# Patient Record
Sex: Male | Born: 1935 | Race: White | Marital: Married | Smoking: Never smoker
Health system: Southern US, Community
[De-identification: ages and names within clinical notes are randomized; demographics above are authoritative.]

## PROBLEM LIST (undated history)

## (undated) DIAGNOSIS — N183 Chronic kidney disease, stage 3 unspecified: Secondary | ICD-10-CM

## (undated) DIAGNOSIS — E039 Hypothyroidism, unspecified: Secondary | ICD-10-CM

## (undated) DIAGNOSIS — E119 Type 2 diabetes mellitus without complications: Secondary | ICD-10-CM

## (undated) DIAGNOSIS — K219 Gastro-esophageal reflux disease without esophagitis: Secondary | ICD-10-CM

## (undated) DIAGNOSIS — E785 Hyperlipidemia, unspecified: Secondary | ICD-10-CM

## (undated) DIAGNOSIS — Z8546 Personal history of malignant neoplasm of prostate: Secondary | ICD-10-CM

## (undated) DIAGNOSIS — I509 Heart failure, unspecified: Secondary | ICD-10-CM

## (undated) DIAGNOSIS — M549 Dorsalgia, unspecified: Secondary | ICD-10-CM

## (undated) DIAGNOSIS — I10 Essential (primary) hypertension: Secondary | ICD-10-CM

## (undated) DIAGNOSIS — M1990 Unspecified osteoarthritis, unspecified site: Secondary | ICD-10-CM

## (undated) DIAGNOSIS — E1121 Type 2 diabetes mellitus with diabetic nephropathy: Secondary | ICD-10-CM

## (undated) HISTORY — PX: ORIF CLAVICLE FRACTURE: SUR924

## (undated) HISTORY — DX: Chronic kidney disease, stage 3 unspecified: N18.30

## (undated) HISTORY — DX: Essential (primary) hypertension: I10

## (undated) HISTORY — DX: Hypothyroidism, unspecified: E03.9

## (undated) HISTORY — DX: Type 2 diabetes mellitus with diabetic nephropathy: E11.21

## (undated) HISTORY — DX: Hyperlipidemia, unspecified: E78.5

## (undated) HISTORY — DX: Gastro-esophageal reflux disease without esophagitis: K21.9

## (undated) HISTORY — DX: Dorsalgia, unspecified: M54.9

## (undated) HISTORY — DX: Unspecified osteoarthritis, unspecified site: M19.90

## (undated) HISTORY — PX: APPENDECTOMY: SHX54

## (undated) HISTORY — DX: Personal history of malignant neoplasm of prostate: Z85.46

## (undated) HISTORY — PX: PROSTATE SURGERY: SHX751

## (undated) HISTORY — DX: Heart failure, unspecified: I50.9

## (undated) HISTORY — DX: Type 2 diabetes mellitus without complications: E11.9

---

## 2011-01-18 ENCOUNTER — Other Ambulatory Visit: Payer: Self-pay | Admitting: Orthopedic Surgery

## 2011-01-18 DIAGNOSIS — M545 Low back pain: Secondary | ICD-10-CM

## 2011-01-23 ENCOUNTER — Ambulatory Visit
Admission: RE | Admit: 2011-01-23 | Discharge: 2011-01-23 | Disposition: A | Discharge status: Home / Self Care | Payer: Medicare Other | Source: Ambulatory Visit | Attending: Orthopedic Surgery | Admitting: Orthopedic Surgery

## 2011-01-23 DIAGNOSIS — M545 Low back pain: Secondary | ICD-10-CM

## 2011-12-27 IMAGING — CT CT L SPINE W/O CM
4 of 10 series · 13 of 33 positions shown, 15 images · non-contrast
Comparison: None.

CLINICAL DATA: Low back and right leg pain for 1 year, getting
worse.  History of prostate cancer.

CT LUMBAR SPINE WITHOUT CONTRAST
TECHNIQUE: Multidetector CT imaging of the lumbar spine was
performed without intravenous contrast administration. Multiplanar
CT image reconstructions were also generated.

[Series 2: l spine bone · axial · 0.27mm/px · z∈[-160,-76]mm · 2 of 102 slices shown, 3 images]
[im 34/102  soft-tissue]
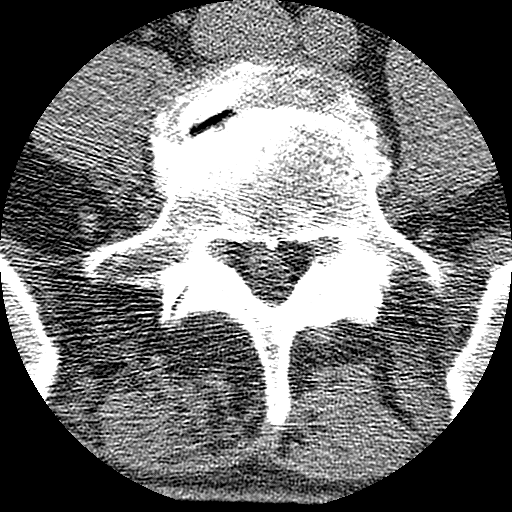
[im 34/102  bone]
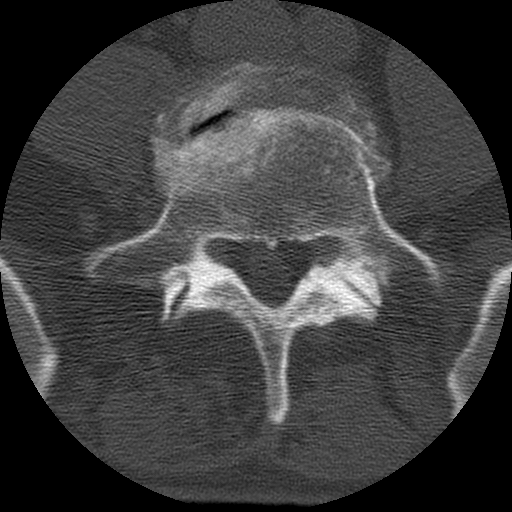
[im 68/102  bone]
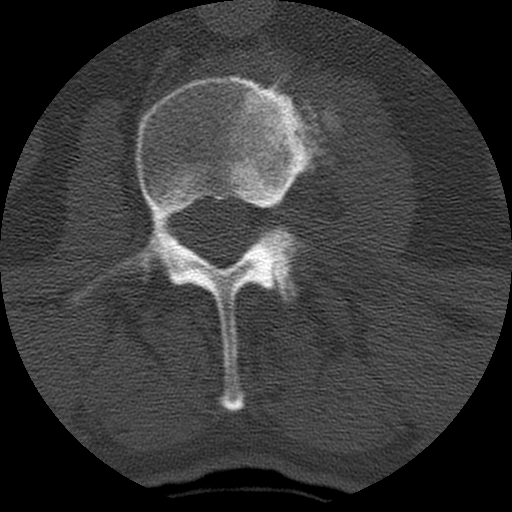

[Series 3: l spine soft · axial · 0.27mm/px · z∈[-180,-56]mm · 3 of 102 slices shown]
[im 26/102  soft-tissue]
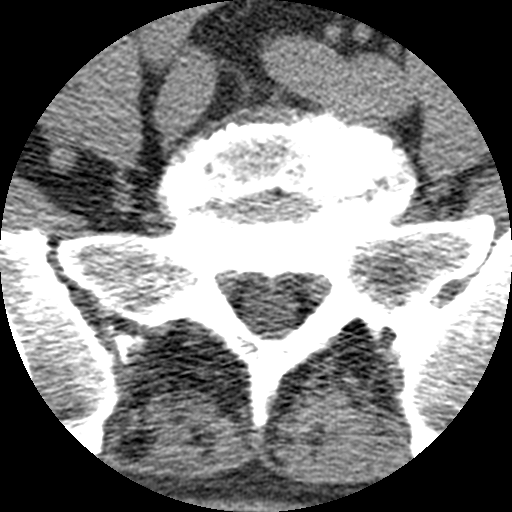
[im 51/102  soft-tissue]
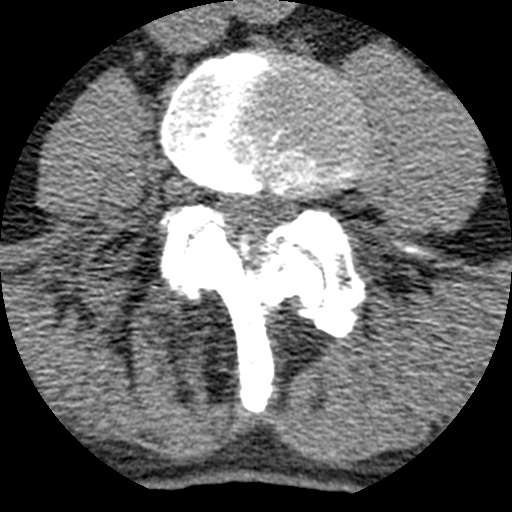
[im 76/102  soft-tissue]
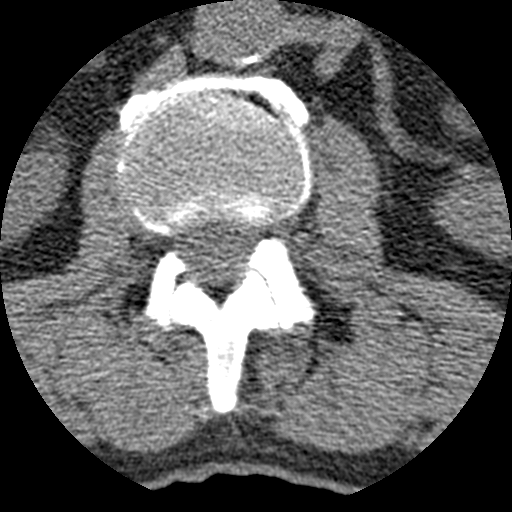

[Series 103: cor · coronal · 0.51mm/px · 3 of 60 slices shown]
[im 15/60  bone]
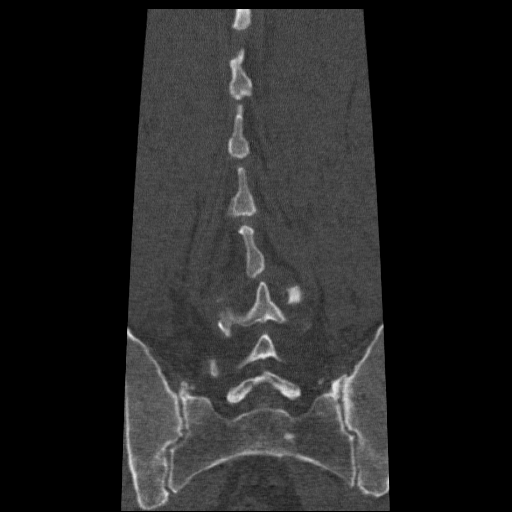
[im 30/60  bone]
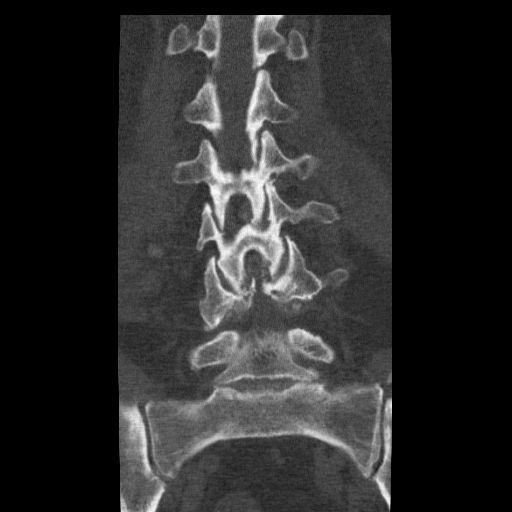
[im 45/60  bone]
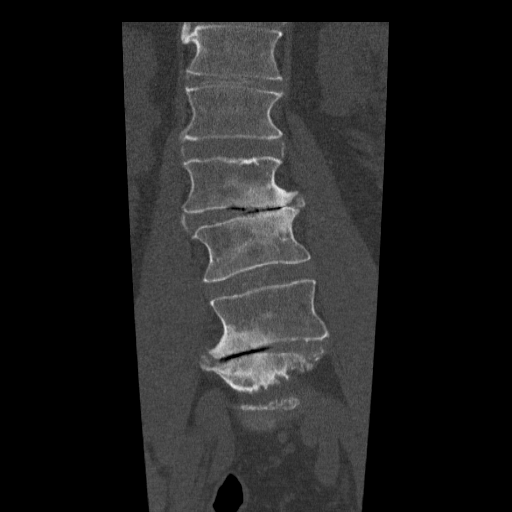

[Series 105: sag · sagittal · 0.51mm/px · 5 of 60 slices shown, 6 images]
[im 20/60  bone]
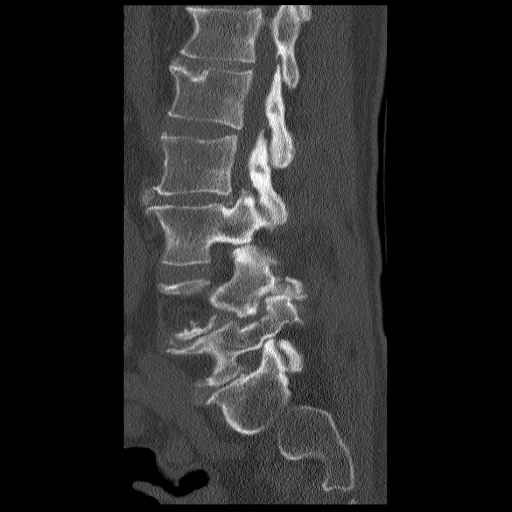
[im 25/60  bone]
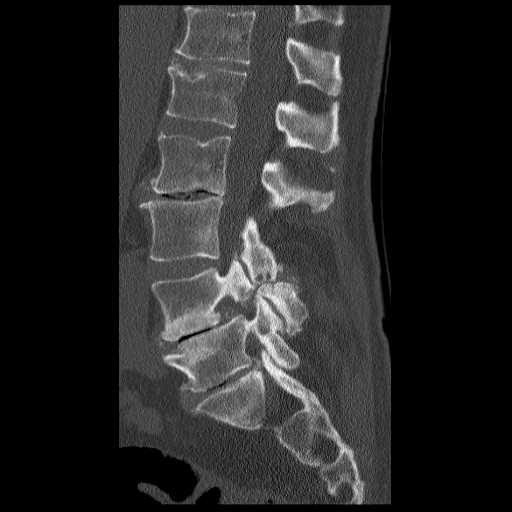
[im 30/60  soft-tissue]
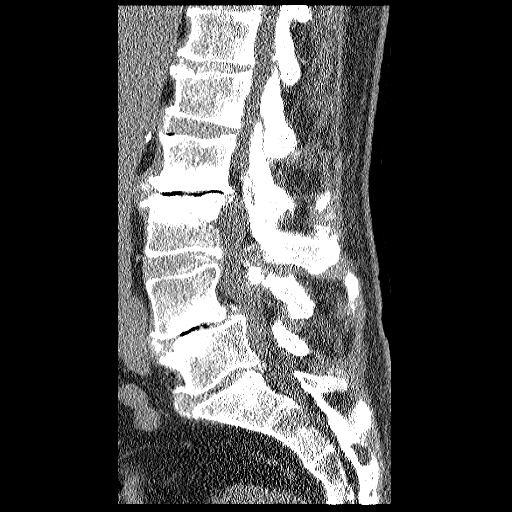
[im 30/60  bone]
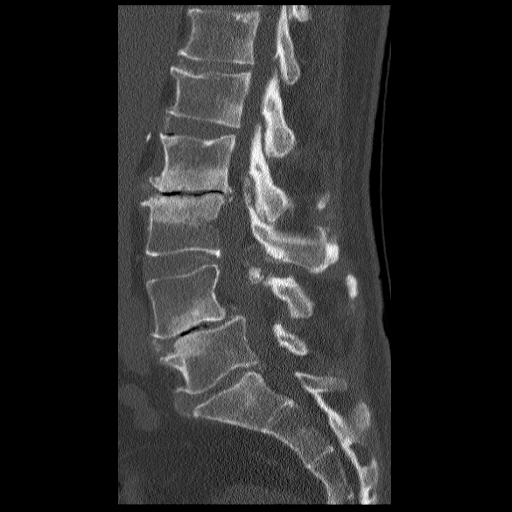
[im 35/60  bone]
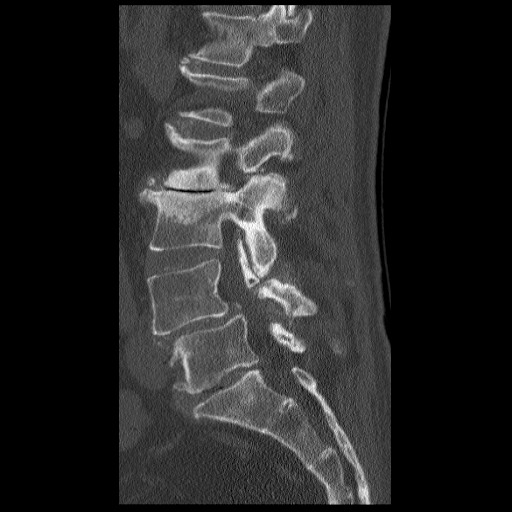
[im 40/60  bone]
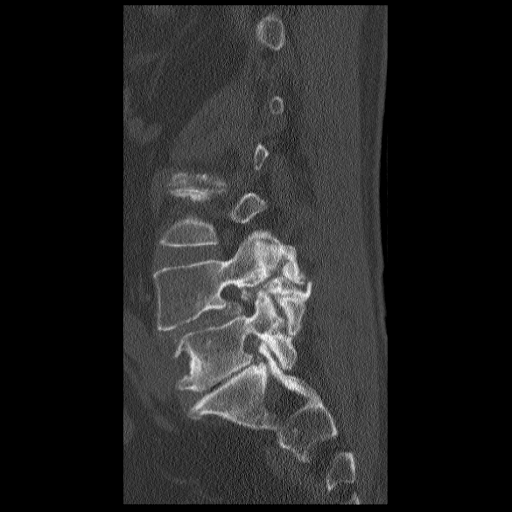

[13 of 33 positions shown; findings below may reference images not displayed]

FINDINGS: 7 mm anterolisthesis L4 on L5 relates to facet
degenerative change and bilateral L4 spondylolysis.  Recommend
standing flexion/extension views to evaluate for dynamic
instability.

Advanced degenerative disc disease at L2-3, L4-5, and L5-S1.
Sclerotic changes above below these levels are most consistent with
degenerative change rather than a sclerotic metastatic disease.

A tiny sclerotic lucency left S2 could be a metastasis.  Correlate
with PSA and bone scan.  Sclerotic posterior elements are likely
degenerative.

Degenerative scoliosis is observed related to asymmetric loss of
interspace height at L2-3 on the left and  asymmetric loss of
interspace height at L4-5 on the right.  The L5-S1 disc space is
narrowed diffusely.

Nonaneurysmal atherosclerotic calcification of the aortoiliac
system.  No visible retroperitoneal adenopathy.  Moderate bilateral
SI joint sclerotic change.

The individual disc spaces are examined as follows:

L1-2:  Mild facet arthropathy.  Anterior spurring.  No clear-cut
posterior disc protrusion or spinal stenosis.

L2-3:  Asymmetric loss of interspace height on the left associated
with calcified central and leftward protrusion extending into the
foramen along with a large osteophytic spur.  Left greater than
right facet arthropathy.  Left L2 and left L3 nerve root
encroachment.

L3-4:  Advanced facet arthropathy.  Mild annular bulging.  Mild
central canal stenosis without definite L4 or L3 nerve root
encroachment.

L4-5:  Bilateral L4 spondylolysis.  7 mm anterolisthesis with the
patient recumbent for CT.  Central bulging annular fibers with
vacuum disc phenomenon, worse on the right.  Focal protrusion
extends into the right neural foramen.  Advanced facet arthropathy.
Moderate central canal stenosis.  Bilateral L5 nerve root
encroachment  worse on the right.  Bilateral neural foraminal
narrowing affects both L4 nerve roots, right greater than left.

L5-S1:  Shallow central protrusion.  Moderate facet arthropathy.
Disc material extends into both neural foramina.  No S1 nerve root
compression in the canal but there is significant foraminal
narrowing affecting both L5 nerve roots.
IMPRESSION: Bilateral L4 spondylolysis with 7 mm grade I spondylolisthesis L4
on L5. Right greater than left L4 and L5 nerve root encroachment
are present at this level.

Disc space narrowing at L5-S1 with central protrusion extending
into both neural foramina along with posterior element hypertrophy.
Bilateral L5 neural encroachment is  likely at this level.

Predominately leftward pathology at L2-3 with disc space narrowing
and bony overgrowth as well as a calcified central leftward
protrusion affects the left L2 the left L3 nerve roots.

 Single sclerotic density at S2 on the left could represent a
metastasis.  No other suspicious sclerotic densities are seen
however.

## 2015-03-16 DIAGNOSIS — N183 Chronic kidney disease, stage 3 (moderate): Secondary | ICD-10-CM | POA: Diagnosis not present

## 2015-03-16 DIAGNOSIS — I5022 Chronic systolic (congestive) heart failure: Secondary | ICD-10-CM | POA: Diagnosis not present

## 2015-03-16 DIAGNOSIS — I10 Essential (primary) hypertension: Secondary | ICD-10-CM | POA: Diagnosis not present

## 2015-06-16 DIAGNOSIS — E785 Hyperlipidemia, unspecified: Secondary | ICD-10-CM | POA: Diagnosis not present

## 2015-06-16 DIAGNOSIS — Z23 Encounter for immunization: Secondary | ICD-10-CM | POA: Diagnosis not present

## 2015-06-16 DIAGNOSIS — I10 Essential (primary) hypertension: Secondary | ICD-10-CM | POA: Diagnosis not present

## 2015-06-16 DIAGNOSIS — N183 Chronic kidney disease, stage 3 (moderate): Secondary | ICD-10-CM | POA: Diagnosis not present

## 2015-07-27 DIAGNOSIS — K5730 Diverticulosis of large intestine without perforation or abscess without bleeding: Secondary | ICD-10-CM | POA: Diagnosis not present

## 2015-07-27 DIAGNOSIS — K219 Gastro-esophageal reflux disease without esophagitis: Secondary | ICD-10-CM | POA: Diagnosis not present

## 2015-08-23 DIAGNOSIS — D126 Benign neoplasm of colon, unspecified: Secondary | ICD-10-CM | POA: Diagnosis not present

## 2015-08-23 DIAGNOSIS — N189 Chronic kidney disease, unspecified: Secondary | ICD-10-CM | POA: Diagnosis not present

## 2015-08-23 DIAGNOSIS — Z1211 Encounter for screening for malignant neoplasm of colon: Secondary | ICD-10-CM | POA: Diagnosis not present

## 2015-08-23 DIAGNOSIS — I129 Hypertensive chronic kidney disease with stage 1 through stage 4 chronic kidney disease, or unspecified chronic kidney disease: Secondary | ICD-10-CM | POA: Diagnosis not present

## 2015-08-23 DIAGNOSIS — Z9049 Acquired absence of other specified parts of digestive tract: Secondary | ICD-10-CM | POA: Diagnosis not present

## 2015-08-23 DIAGNOSIS — K5730 Diverticulosis of large intestine without perforation or abscess without bleeding: Secondary | ICD-10-CM | POA: Diagnosis not present

## 2015-08-23 DIAGNOSIS — D122 Benign neoplasm of ascending colon: Secondary | ICD-10-CM | POA: Diagnosis not present

## 2015-08-23 DIAGNOSIS — D124 Benign neoplasm of descending colon: Secondary | ICD-10-CM | POA: Diagnosis not present

## 2015-08-23 DIAGNOSIS — Z86010 Personal history of colonic polyps: Secondary | ICD-10-CM | POA: Diagnosis not present

## 2015-09-14 DIAGNOSIS — N183 Chronic kidney disease, stage 3 (moderate): Secondary | ICD-10-CM | POA: Diagnosis not present

## 2015-09-14 DIAGNOSIS — N185 Chronic kidney disease, stage 5: Secondary | ICD-10-CM | POA: Diagnosis not present

## 2015-09-14 DIAGNOSIS — E7800 Pure hypercholesterolemia, unspecified: Secondary | ICD-10-CM | POA: Diagnosis not present

## 2015-09-14 DIAGNOSIS — E785 Hyperlipidemia, unspecified: Secondary | ICD-10-CM | POA: Diagnosis not present

## 2015-09-14 DIAGNOSIS — I10 Essential (primary) hypertension: Secondary | ICD-10-CM | POA: Diagnosis not present

## 2019-12-01 DIAGNOSIS — E1165 Type 2 diabetes mellitus with hyperglycemia: Secondary | ICD-10-CM | POA: Diagnosis not present

## 2019-12-01 DIAGNOSIS — I10 Essential (primary) hypertension: Secondary | ICD-10-CM | POA: Diagnosis not present

## 2020-02-25 DIAGNOSIS — J069 Acute upper respiratory infection, unspecified: Secondary | ICD-10-CM | POA: Diagnosis not present

## 2020-02-25 DIAGNOSIS — Z20828 Contact with and (suspected) exposure to other viral communicable diseases: Secondary | ICD-10-CM | POA: Diagnosis not present

## 2020-03-23 DIAGNOSIS — I10 Essential (primary) hypertension: Secondary | ICD-10-CM | POA: Diagnosis not present

## 2020-03-23 DIAGNOSIS — E119 Type 2 diabetes mellitus without complications: Secondary | ICD-10-CM | POA: Diagnosis not present

## 2020-06-27 DIAGNOSIS — Z125 Encounter for screening for malignant neoplasm of prostate: Secondary | ICD-10-CM | POA: Diagnosis not present

## 2020-06-27 DIAGNOSIS — E039 Hypothyroidism, unspecified: Secondary | ICD-10-CM | POA: Diagnosis not present

## 2020-06-27 DIAGNOSIS — E119 Type 2 diabetes mellitus without complications: Secondary | ICD-10-CM | POA: Diagnosis not present

## 2020-06-27 DIAGNOSIS — M4802 Spinal stenosis, cervical region: Secondary | ICD-10-CM | POA: Diagnosis not present

## 2020-06-27 DIAGNOSIS — Z0000 Encounter for general adult medical examination without abnormal findings: Secondary | ICD-10-CM | POA: Diagnosis not present

## 2020-06-27 DIAGNOSIS — N1831 Chronic kidney disease, stage 3a: Secondary | ICD-10-CM | POA: Diagnosis not present

## 2020-06-27 DIAGNOSIS — E7800 Pure hypercholesterolemia, unspecified: Secondary | ICD-10-CM | POA: Diagnosis not present

## 2020-06-27 DIAGNOSIS — Z6824 Body mass index (BMI) 24.0-24.9, adult: Secondary | ICD-10-CM | POA: Diagnosis not present

## 2020-06-27 DIAGNOSIS — I10 Essential (primary) hypertension: Secondary | ICD-10-CM | POA: Diagnosis not present

## 2020-06-27 DIAGNOSIS — Z23 Encounter for immunization: Secondary | ICD-10-CM | POA: Diagnosis not present

## 2020-06-27 DIAGNOSIS — Z1389 Encounter for screening for other disorder: Secondary | ICD-10-CM | POA: Diagnosis not present

## 2020-09-28 DIAGNOSIS — H527 Unspecified disorder of refraction: Secondary | ICD-10-CM | POA: Diagnosis not present

## 2020-09-28 DIAGNOSIS — I10 Essential (primary) hypertension: Secondary | ICD-10-CM | POA: Diagnosis not present

## 2020-09-28 DIAGNOSIS — E119 Type 2 diabetes mellitus without complications: Secondary | ICD-10-CM | POA: Diagnosis not present

## 2020-09-28 DIAGNOSIS — H3589 Other specified retinal disorders: Secondary | ICD-10-CM | POA: Diagnosis not present

## 2020-09-28 DIAGNOSIS — Z961 Presence of intraocular lens: Secondary | ICD-10-CM | POA: Diagnosis not present

## 2020-12-28 DIAGNOSIS — E119 Type 2 diabetes mellitus without complications: Secondary | ICD-10-CM | POA: Diagnosis not present

## 2021-04-03 DIAGNOSIS — N1831 Chronic kidney disease, stage 3a: Secondary | ICD-10-CM | POA: Diagnosis not present

## 2021-04-03 DIAGNOSIS — I10 Essential (primary) hypertension: Secondary | ICD-10-CM | POA: Diagnosis not present

## 2021-04-03 DIAGNOSIS — E1121 Type 2 diabetes mellitus with diabetic nephropathy: Secondary | ICD-10-CM | POA: Diagnosis not present

## 2021-07-05 DIAGNOSIS — Z23 Encounter for immunization: Secondary | ICD-10-CM | POA: Diagnosis not present

## 2021-07-05 DIAGNOSIS — E039 Hypothyroidism, unspecified: Secondary | ICD-10-CM | POA: Diagnosis not present

## 2021-07-05 DIAGNOSIS — Z8546 Personal history of malignant neoplasm of prostate: Secondary | ICD-10-CM | POA: Diagnosis not present

## 2021-07-05 DIAGNOSIS — N1831 Chronic kidney disease, stage 3a: Secondary | ICD-10-CM | POA: Diagnosis not present

## 2021-07-05 DIAGNOSIS — I10 Essential (primary) hypertension: Secondary | ICD-10-CM | POA: Diagnosis not present

## 2021-07-05 DIAGNOSIS — E1121 Type 2 diabetes mellitus with diabetic nephropathy: Secondary | ICD-10-CM | POA: Diagnosis not present

## 2021-07-05 DIAGNOSIS — Z6822 Body mass index (BMI) 22.0-22.9, adult: Secondary | ICD-10-CM | POA: Diagnosis not present

## 2021-07-05 DIAGNOSIS — E7800 Pure hypercholesterolemia, unspecified: Secondary | ICD-10-CM | POA: Diagnosis not present

## 2021-07-05 DIAGNOSIS — Z0000 Encounter for general adult medical examination without abnormal findings: Secondary | ICD-10-CM | POA: Diagnosis not present

## 2021-07-05 DIAGNOSIS — Z1331 Encounter for screening for depression: Secondary | ICD-10-CM | POA: Diagnosis not present

## 2021-10-04 DIAGNOSIS — E119 Type 2 diabetes mellitus without complications: Secondary | ICD-10-CM | POA: Diagnosis not present

## 2021-10-04 DIAGNOSIS — H40051 Ocular hypertension, right eye: Secondary | ICD-10-CM | POA: Diagnosis not present

## 2021-10-06 DIAGNOSIS — I10 Essential (primary) hypertension: Secondary | ICD-10-CM | POA: Diagnosis not present

## 2021-10-06 DIAGNOSIS — E1121 Type 2 diabetes mellitus with diabetic nephropathy: Secondary | ICD-10-CM | POA: Diagnosis not present

## 2021-10-06 DIAGNOSIS — E7800 Pure hypercholesterolemia, unspecified: Secondary | ICD-10-CM | POA: Diagnosis not present

## 2021-10-06 DIAGNOSIS — N1831 Chronic kidney disease, stage 3a: Secondary | ICD-10-CM | POA: Diagnosis not present

## 2022-01-05 DIAGNOSIS — E7800 Pure hypercholesterolemia, unspecified: Secondary | ICD-10-CM | POA: Diagnosis not present

## 2022-01-05 DIAGNOSIS — E039 Hypothyroidism, unspecified: Secondary | ICD-10-CM | POA: Diagnosis not present

## 2022-01-05 DIAGNOSIS — E119 Type 2 diabetes mellitus without complications: Secondary | ICD-10-CM | POA: Diagnosis not present

## 2022-01-05 DIAGNOSIS — I10 Essential (primary) hypertension: Secondary | ICD-10-CM | POA: Diagnosis not present

## 2022-04-09 DIAGNOSIS — N1831 Chronic kidney disease, stage 3a: Secondary | ICD-10-CM | POA: Diagnosis not present

## 2022-04-09 DIAGNOSIS — I10 Essential (primary) hypertension: Secondary | ICD-10-CM | POA: Diagnosis not present

## 2022-04-09 DIAGNOSIS — E1121 Type 2 diabetes mellitus with diabetic nephropathy: Secondary | ICD-10-CM | POA: Diagnosis not present

## 2022-04-12 DIAGNOSIS — H40051 Ocular hypertension, right eye: Secondary | ICD-10-CM | POA: Diagnosis not present

## 2022-04-26 DIAGNOSIS — H353131 Nonexudative age-related macular degeneration, bilateral, early dry stage: Secondary | ICD-10-CM | POA: Diagnosis not present

## 2022-04-26 DIAGNOSIS — E119 Type 2 diabetes mellitus without complications: Secondary | ICD-10-CM | POA: Diagnosis not present

## 2022-04-26 DIAGNOSIS — H35372 Puckering of macula, left eye: Secondary | ICD-10-CM | POA: Diagnosis not present

## 2022-06-11 DIAGNOSIS — H5203 Hypermetropia, bilateral: Secondary | ICD-10-CM | POA: Diagnosis not present

## 2022-07-11 DIAGNOSIS — E1121 Type 2 diabetes mellitus with diabetic nephropathy: Secondary | ICD-10-CM | POA: Diagnosis not present

## 2022-07-11 DIAGNOSIS — Z23 Encounter for immunization: Secondary | ICD-10-CM | POA: Diagnosis not present

## 2022-07-11 DIAGNOSIS — N1831 Chronic kidney disease, stage 3a: Secondary | ICD-10-CM | POA: Diagnosis not present

## 2022-09-14 ENCOUNTER — Other Ambulatory Visit: Payer: Self-pay

## 2022-09-14 MED ORDER — RYBELSUS 7 MG PO TABS: 1.0000 | ORAL_TABLET | Freq: Every morning | ORAL | 5 refills | Status: DC

## 2022-10-16 ENCOUNTER — Ambulatory Visit: Payer: Medicare PPO | Admitting: Internal Medicine

## 2022-10-16 ENCOUNTER — Encounter: Payer: Self-pay | Admitting: Internal Medicine

## 2022-10-16 VITALS — BP 140/80 | HR 73 | Temp 97.3°F | Resp 16 | Ht 71.0 in | Wt 178.4 lb

## 2022-10-16 DIAGNOSIS — Z8546 Personal history of malignant neoplasm of prostate: Secondary | ICD-10-CM | POA: Diagnosis not present

## 2022-10-16 DIAGNOSIS — E7800 Pure hypercholesterolemia, unspecified: Secondary | ICD-10-CM | POA: Insufficient documentation

## 2022-10-16 DIAGNOSIS — K219 Gastro-esophageal reflux disease without esophagitis: Secondary | ICD-10-CM | POA: Diagnosis not present

## 2022-10-16 DIAGNOSIS — Z6824 Body mass index (BMI) 24.0-24.9, adult: Secondary | ICD-10-CM | POA: Diagnosis not present

## 2022-10-16 DIAGNOSIS — M159 Polyosteoarthritis, unspecified: Secondary | ICD-10-CM | POA: Diagnosis not present

## 2022-10-16 DIAGNOSIS — E1121 Type 2 diabetes mellitus with diabetic nephropathy: Secondary | ICD-10-CM

## 2022-10-16 DIAGNOSIS — I10 Essential (primary) hypertension: Secondary | ICD-10-CM

## 2022-10-16 DIAGNOSIS — E039 Hypothyroidism, unspecified: Secondary | ICD-10-CM | POA: Diagnosis not present

## 2022-10-16 DIAGNOSIS — N1831 Chronic kidney disease, stage 3a: Secondary | ICD-10-CM | POA: Insufficient documentation

## 2022-10-16 DIAGNOSIS — M5030 Other cervical disc degeneration, unspecified cervical region: Secondary | ICD-10-CM

## 2022-10-16 DIAGNOSIS — Z0000 Encounter for general adult medical examination without abnormal findings: Secondary | ICD-10-CM | POA: Diagnosis not present

## 2022-10-16 NOTE — Assessment & Plan Note (Signed)
He has a history of prostate cancer with remission.  He denies any urinary complaints today.

## 2022-10-16 NOTE — Assessment & Plan Note (Signed)
His BP is borderline.  We will see what his BP is doing on his next visit.

## 2022-10-16 NOTE — Assessment & Plan Note (Signed)
His reflux is controlled on nexium.

## 2022-10-16 NOTE — Assessment & Plan Note (Signed)
He remains on an ACE-I at this time.  I want him to avoid NSAIDS as possible.

## 2022-10-16 NOTE — Progress Notes (Signed)
Preventive Screening-Counseling & Management     Daved MASSIE MEES is a 87 y.o. male who presents for Medicare Annual/Subsequent preventive examination.  Rashon K. Claggett is a 87 year old Caucasian/White male who presents for his annual wellness exam.  This patient's past medical history Back Pain, Chronic kidney disease, Stage III (moderate), Congestive Heart Failure, Diabetes Mellitus, Type II, GERD, Hyperlipidemia, Hypertension, Hypothyroidism, and Prostate Neoplasm, Malignant.   He had a dilated eye exam performed on 10/06/2021 where they are following him for macular degeneration but he had no diabetic retinopathy. He states his vision is doing well.  He has an annual eye appointment scheduled next week with [facility].  He did have a colonoscopy done on 07/2015 which showed colon polyps and some diverticulosis. GI wrote to see him back for repeat colonoscopy in 3 years but the patient states they told him he did not have to come back unless he wanted to. He does have a history of prostate cancer diagnosed around 2007 where he has a prostatectomy. He denies any problems with urination at this time. He never received chemo or radiation treatment and saw his urologist on 12/2015 where he was not having any urinary complaints and they discharged him from their service. 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 The patient does get yearly flu vaccine. He had the pneumovax 23 vaccine in 02/2019. He has a zostavax vaccine since the age of 61. He had a prevnar 13 vaccine in 11/2014. He is not interested in the RSV vaccine.  He has had 3 COVID-19 vaccines including 1 booster. The patient denies any memory loss, depression or anxiety. He is on an ASA 81 mg daily.   The patient is a 87 year old Caucasian/White male who returns for a follow-up visit for his T2 diabetes. Last year year his HgBA1c was elevated in 11/2019 and we started him on rybelsus.  He was diagnosed with T2 diabetes in 12/2016.  He remains on  metformin ER 2000mg  daily and rybelsus 7mg  daily.  Since the last visit, there have been no problems.  He is not walking as much as they would like. He specifically denies unexplained abdominal pain, nausea or vomiting, documented hypoglycemia.  He does check his blood sugars about once every couple of weeks and they range 100-200. His last HgbA1c was done 3 months ago and was 5.8%. He has no term complications of diabetes with no diabetic retinopathy, neuropathy or cardiovascular disease.  He does have a history of diabetic nephropathy at this time.    He has a history of Stage IIIa CKD where his last visit with his nephrologist was in 08/2015.  He was told his kidneys were stable. He has been told to stop his lasix.  Again, he has had short term dialysis several years ago when he was admitted to [facility].  His Kidney function near discharge at [facility] last year was 2.1 and his repeat checks in our office his creatinine has range 1.5-2. Patient states he has no problems with urination.  The patient is a 87 year old Caucasian/White male who presents for a follow-up evaluation of hypertension.  This past year, his BP was elevated and I increased his amlodipine from 5mg  to 10mg  daily.    Last year we increased his lisinopril from 30mg  to 40mg  daily.  The patient has not been checking his blood pressure at home. The patient's current medications include: amlodipine 10 mg daily and lisinopril 40 mg daily. The  patient has been tolerating his medications well. The patient denies any headache, visual changes, dizziness, lightheadness, chest pain, shortness of breath, weakness/numbness, and edema.  He reports there have been no other symptoms noted.      Tevion K. Brouhard returns today for routine followup on his cholesterol.  On his last visit, his cholesterol was too controlled and I asked him to start taking his crestor 5mg  every other day.  Overall, he states he is doing well and is without any complaints or  problems at this time. He specifically denies abdominal pain, nausea, vomiting, diarrhea, myalgias, and fatigue. He remains on dietary management as well as the following cholesterol lowering medications Fish Oil 2400mg , crestor 5mg  po every other day, and fenofibrate 135mg . He has eaten already today.   The patient is a 87 year old White male who returns for a regularly scheduled thyroid check. Since the last visit, there has been no overall change in her status. She remains on levothyroxine Oral tablet 125 mcg daly. He claims to have no symptoms suggestive of thyroid imbalance specifically denying fatigue, cold intolerance, heat intolerance, tremors, anxiety, unexplained weight changes, diarrhea, constipation and insomnia.     This patient also has mild chronic systolic CHF which we diagnosed in 2013.  He has had a history of moderately severe CHF in the past where a stress test showed no evidence of ischemia but he did have an EF 26% with global hypokinesis.  An ECHO in 2013 showed a mildly reduced LV systolic function but his recent ECHO from 10/2013 showed a normal systolic function with an EF 53% with mild MR.  He has no baseline symptoms of CHF.  He has not seen a cardiologist since then.  Specifically denied complaints: chest pain, palpitations, exertional dyspnea, worsening orthopnea, worsening edema, and worsening exertional dyspnea. Interval history: none.  An EKG was done in 09/2013 which showed sinus brady with some nonspecific ST changes.   He does have Osteoarthritis of his knees which has affected his exercise.  He also had  swelling in his left wrist in the past where he was sent to rheumatology and was told it was arthritis and was given a steriod injection.  He has not had any problems with his wrist since.  He states he is not having problems with his knees at this time and he denies taking any OTC meds for his arthritis.  Today, he states he has arthritic pain including his buttocks, ankles  and feet.  The patient also has a history of GERD which is controlled with nexium.  If he does not takes his meds, he will have reflux symptoms the next day.     Mr. Cabreja also has a history of anterior decompression of his cervical spine (AICD) on 11/30/2013 of his C4-C5 and C5-C6 spine with Dr. 11/2013.  Since his surgery, the numbness in his left arm is now gone.  Again, the patient came and saw me with neck pain in 08/2013.  I obtained plain films of his neck which showed DDD but he continued to complain of pain where I referred him to Dr. Sudie Bailey.  Dr. 01/30/2014 obtained a MRI of his cervical spine which demonstrated DDD with foraminal stenosis affected the right C4 nerve root, foraminal stenosis affecting both C5 nerve roots with mild central stenosis, smal central disc protrusion of C5-C6 with mild central stenosis with bilateral foraminal stenosis with collapse of the disc space with stenosis affecting both C6 nerve roots, bilateral stenosis of  C6-C7- with overall moderate multilevel cervical spondylosis and multilevel foraminal stenosis as described.  Currently states no problems with his neck since his surgery.    Mr. Stern also has had problems with gynecomastia in the past.  He had a mammogram which showed bilateral gynecomastia R>L but no masses.  We also obtained lab work and his estrogen levels were elevated.  At that point, I had stopped him off of his protonix and his swelling and pain resolved.                     Are there smokers in your home (other than you)? Yes  Risk Factors Current exercise habits:  as above   Dietary issues discussed: none   Depression Screen (Note: if answer to either of the following is "Yes", a more complete depression screening is indicated)   Over the past two weeks, have you felt down, depressed or hopeless? No  Over the past two weeks, have you felt little interest or pleasure in doing things? No  Have you lost interest or pleasure in  daily life? No  Do you often feel hopeless? No  Do you cry easily over simple problems? No  Activities of Daily Living In your present state of health, do you have any difficulty performing the following activities?:  Driving? No Managing money?  No Feeding yourself? No Getting from bed to chair? No Climbing a flight of stairs? No Preparing food and eating?: No Bathing or showering? No Getting dressed: No Getting to the toilet? No Using the toilet:No Moving around from place to place: No In the past year have you fallen or had a near fall?:No   Are you sexually active?  No  Do you have more than one partner?  No  Hearing Difficulties: No Do you often ask people to speak up or repeat themselves? No Do you experience ringing or noises in your ears? No Do you have difficulty understanding soft or whispered voices? No   Do you feel that you have a problem with memory? No  Do you often misplace items? No  Do you feel safe at home?  Yes  Cognitive Testing  Alert? Yes  Normal Appearance?Yes  Oriented to person? Yes  Place? Yes   Time? Yes  Recall of three objects?  Yes  Can perform simple calculations? Yes  Displays appropriate judgment?Yes  Can read the correct time from a watch face?Yes  Fall Risk Prevention  Any stairs in or around the home? Yes  If so, are there any without handrails? Yes  Home free of loose throw rugs in walkways, pet beds, electrical cords, etc? No  Adequate lighting in your home to reduce risk of falls? Yes  Use of a cane, walker or w/c? No    Time Up and Go  Was the test performed? Yes .  Length of time to ambulate 10 feet: 10 sec.   Gait unsteady without use of assistive device, provider informed and interventions were implemented    Advanced Directives have been discussed with the patient? No   List the Names of Other Physician/Practitioners you currently use: Patient Care Team: Leonia Reader, Barbara Cower, MD as PCP - General (Internal Medicine)     Past Medical History:  Diagnosis Date   Back pain    CHF (congestive heart failure) (HCC)    CKD (chronic kidney disease), stage III (HCC)    Diabetes mellitus, type 2 (HCC)    Diabetic nephropathy associated with type  2 diabetes mellitus (HCC)    GERD (gastroesophageal reflux disease)    History of prostate cancer    Hyperlipidemia    Hypertension    Hypothyroidism    Osteoarthritis     Past Surgical History:  Procedure Laterality Date   APPENDECTOMY     ORIF CLAVICLE FRACTURE     PROSTATE SURGERY        Current Medications  Current Outpatient Medications  Medication Sig Dispense Refill   amLODipine (NORVASC) 10 MG tablet Take 10 mg by mouth daily.     esomeprazole (NEXIUM) 40 MG capsule Take 40 mg by mouth daily.     levothyroxine (SYNTHROID) 125 MCG tablet Take 125 mcg by mouth daily.     lisinopril (ZESTRIL) 40 MG tablet Take 40 mg by mouth daily.     metFORMIN (GLUCOPHAGE-XR) 500 MG 24 hr tablet Take 1,000 mg by mouth 2 (two) times daily.     rosuvastatin (CRESTOR) 5 MG tablet Take 5 mg by mouth daily.     Choline Fenofibrate (FENOFIBRIC ACID) 135 MG CPDR Take 1 capsule by mouth daily.     Omega-3 1000 MG CAPS Take by mouth.     RYBELSUS 7 MG TABS Take 1 tablet by mouth every morning. 30 tablet 5   No current facility-administered medications for this visit.    Allergies Patient has no known allergies.   Social History Social History   Tobacco Use   Smoking status: Never   Smokeless tobacco: Never  Substance Use Topics   Alcohol use: Never     Review of Systems Review of Systems  Constitutional:  Negative for chills, fever, malaise/fatigue and weight loss.  HENT:  Negative for hearing loss and tinnitus.   Eyes:  Negative for blurred vision and double vision.  Respiratory:  Negative for cough, shortness of breath and wheezing.   Cardiovascular:  Negative for chest pain, palpitations and leg swelling.  Gastrointestinal:  Negative for abdominal pain,  blood in stool, constipation, diarrhea, heartburn, melena, nausea and vomiting.  Genitourinary:  Negative for frequency.  Musculoskeletal:  Negative for back pain, myalgias and neck pain.  Skin:  Negative for itching and rash.  Neurological:  Negative for dizziness, weakness and headaches.  Psychiatric/Behavioral:  Negative for depression and memory loss. The patient is not nervous/anxious and does not have insomnia.      Physical Exam:      Body mass index is 24.88 kg/m. BP (!) 140/80   Pulse 73   Temp (!) 97.3 F (36.3 C)   Resp 16   Ht 5\' 11"  (1.803 m)   Wt 178 lb 6.4 oz (80.9 kg)   SpO2 99%   BMI 24.88 kg/m   Physical Exam   Assessment:      Diabetic nephropathy associated with type 2 diabetes mellitus (HCC)  Stage 3a chronic kidney disease (HCC)  History of prostate cancer  Essential hypertension  Hypercholesterolemia  Hypothyroidism, unspecified type  Primary osteoarthritis involving multiple joints  Gastroesophageal reflux disease without esophagitis  DDD (degenerative disc disease), cervical  BMI 24.0-24.9, adult    Plan:     During the course of the visit the patient was educated and counseled about appropriate screening and preventive services including:   Pneumococcal vaccine  Influenza vaccine Colorectal cancer screening Advanced directives: he will discuss with wife  Diet review for nutrition referral? Yes ____  Not Indicated _X___   Patient Instructions (the written plan) was given to the patient.  Essential hypertension His BP is  borderline.  We will see what his BP is doing on his next visit.  Gastroesophageal reflux disease without esophagitis His reflux is controlled on nexium.  Diabetic nephropathy associated with type 2 diabetes mellitus (HCC) He has diabetic nephropathy.  The patient is on rybelsus.  We will continue to control his diabetes at this time.  Hypothyroidism The patient seems euthyroid.  We will check TFT's  today.  DDD (degenerative disc disease), cervical He can use tylenol as needed but he really has no neck complaints today.  Primary osteoarthritis involving multiple joints Again, he can use tylenol prn for any arthritic pains.  Stage 3a chronic kidney disease (HCC) He remains on an ACE-I at this time.  I want him to avoid NSAIDS as possible.  BMI 24.0-24.9, adult He is to continue to eat healthy, exercise and be active.  He is an active Systems developer.  History of prostate cancer He has a history of prostate cancer with remission.  He denies any urinary complaints today.  Hypercholesterolemia We will check a FLP on him today.   Prevention Health maintenance discussed.  He is not interested in the RSV vaccine.  We will obtain some yearly labs.    Medicare Attestation I have personally reviewed: The patient's medical and social history Their use of alcohol, tobacco or illicit drugs Their current medications and supplements The patient's functional ability including ADLs,fall risks, home safety risks, cognitive, and hearing and visual impairment Diet and physical activities Evidence for depression or mood disorders  The patient's weight, height, and BMI have been recorded in the chart.  I have made referrals, counseling, and provided education to the patient based on review of the above and I have provided the patient with a written personalized care plan for preventive services.     Crist Fat, MD   10/16/2022

## 2022-10-16 NOTE — Assessment & Plan Note (Signed)
He can use tylenol as needed but he really has no neck complaints today.

## 2022-10-16 NOTE — Assessment & Plan Note (Signed)
Again, he can use tylenol prn for any arthritic pains.

## 2022-10-16 NOTE — Assessment & Plan Note (Signed)
He has diabetic nephropathy.  The patient is on rybelsus.  We will continue to control his diabetes at this time.

## 2022-10-16 NOTE — Assessment & Plan Note (Signed)
We will check a FLP on him today.

## 2022-10-16 NOTE — Assessment & Plan Note (Signed)
He is to continue to eat healthy, exercise and be active.  He is an active Doctor, general practice.

## 2022-10-16 NOTE — Assessment & Plan Note (Signed)
The patient seems euthyroid.  We will check TFT's today.

## 2022-10-17 LAB — CBC WITH DIFFERENTIAL/PLATELET
Basophils Absolute: 0.1 10*3/uL (ref 0.0–0.2)
Basos: 1 %
EOS (ABSOLUTE): 0.2 10*3/uL (ref 0.0–0.4)
Eos: 3 %
Hematocrit: 43.9 % (ref 37.5–51.0)
Hemoglobin: 14.5 g/dL (ref 13.0–17.7)
Immature Grans (Abs): 0.1 10*3/uL (ref 0.0–0.1)
Immature Granulocytes: 1 %
Lymphocytes Absolute: 2.6 10*3/uL (ref 0.7–3.1)
Lymphs: 32 %
MCH: 30 pg (ref 26.6–33.0)
MCHC: 33 g/dL (ref 31.5–35.7)
MCV: 91 fL (ref 79–97)
Monocytes Absolute: 0.6 10*3/uL (ref 0.1–0.9)
Monocytes: 7 %
Neutrophils Absolute: 4.7 10*3/uL (ref 1.4–7.0)
Neutrophils: 56 %
Platelets: 264 10*3/uL (ref 150–450)
RBC: 4.84 x10E6/uL (ref 4.14–5.80)
RDW: 12.8 % (ref 11.6–15.4)
WBC: 8.3 10*3/uL (ref 3.4–10.8)

## 2022-10-17 LAB — T4, FREE: Free T4: 1.3 ng/dL (ref 0.82–1.77)

## 2022-10-17 LAB — CMP14 + ANION GAP
ALT: 16 IU/L (ref 0–44)
AST: 20 IU/L (ref 0–40)
Albumin/Globulin Ratio: 1.9 (ref 1.2–2.2)
Albumin: 4.5 g/dL (ref 3.7–4.7)
Alkaline Phosphatase: 32 IU/L — ABNORMAL LOW (ref 44–121)
Anion Gap: 15 mmol/L (ref 10.0–18.0)
BUN/Creatinine Ratio: 16 (ref 10–24)
BUN: 24 mg/dL (ref 8–27)
Bilirubin Total: 0.5 mg/dL (ref 0.0–1.2)
CO2: 21 mmol/L (ref 20–29)
Calcium: 9.5 mg/dL (ref 8.6–10.2)
Chloride: 106 mmol/L (ref 96–106)
Creatinine, Ser: 1.46 mg/dL — ABNORMAL HIGH (ref 0.76–1.27)
Globulin, Total: 2.4 g/dL (ref 1.5–4.5)
Glucose: 92 mg/dL (ref 70–99)
Potassium: 5.2 mmol/L (ref 3.5–5.2)
Sodium: 142 mmol/L (ref 134–144)
Total Protein: 6.9 g/dL (ref 6.0–8.5)
eGFR: 47 mL/min/{1.73_m2} — ABNORMAL LOW (ref >59)

## 2022-10-17 LAB — LIPID PANEL
Chol/HDL Ratio: 3.5 ratio (ref 0.0–5.0)
Cholesterol, Total: 140 mg/dL (ref 100–199)
HDL: 40 mg/dL (ref >39)
LDL Chol Calc (NIH): 85 mg/dL (ref 0–99)
Triglycerides: 78 mg/dL (ref 0–149)
VLDL Cholesterol Cal: 15 mg/dL (ref 5–40)

## 2022-10-17 LAB — MICROALBUMIN / CREATININE URINE RATIO
Creatinine, Urine: 91.2 mg/dL
Microalb/Creat Ratio: 4 mg/g creat (ref 0–29)
Microalbumin, Urine: 3.7 ug/mL

## 2022-10-17 LAB — PSA: Prostate Specific Ag, Serum: <0.1 ng/mL (ref 0.0–4.0)

## 2022-10-17 LAB — HEMOGLOBIN A1C
Est. average glucose Bld gHb Est-mCnc: 123 mg/dL
Hgb A1c MFr Bld: 5.9 % — ABNORMAL HIGH (ref 4.8–5.6)

## 2022-10-17 LAB — TSH: TSH: 5.95 u[IU]/mL — ABNORMAL HIGH (ref 0.450–4.500)

## 2022-10-23 ENCOUNTER — Telehealth: Payer: Self-pay

## 2022-10-23 NOTE — Telephone Encounter (Signed)
Pt notified of lab results

## 2022-10-23 NOTE — Telephone Encounter (Signed)
-----  Message from Townsend Roger, MD sent at 10/21/2022  7:22 PM EST ----- His labs look good.

## 2022-10-31 DIAGNOSIS — H5340 Unspecified visual field defects: Secondary | ICD-10-CM | POA: Diagnosis not present

## 2022-10-31 DIAGNOSIS — E119 Type 2 diabetes mellitus without complications: Secondary | ICD-10-CM | POA: Diagnosis not present

## 2022-10-31 DIAGNOSIS — H35372 Puckering of macula, left eye: Secondary | ICD-10-CM | POA: Diagnosis not present

## 2022-11-19 ENCOUNTER — Other Ambulatory Visit: Payer: Self-pay | Admitting: Internal Medicine

## 2022-12-18 ENCOUNTER — Other Ambulatory Visit: Payer: Self-pay | Admitting: Internal Medicine

## 2023-01-14 ENCOUNTER — Ambulatory Visit: Payer: Medicare PPO | Admitting: Internal Medicine

## 2023-01-14 ENCOUNTER — Encounter: Payer: Self-pay | Admitting: Internal Medicine

## 2023-01-14 VITALS — BP 128/82 | HR 78 | Temp 97.3°F | Resp 16 | Ht 71.0 in | Wt 175.2 lb

## 2023-01-14 DIAGNOSIS — E1121 Type 2 diabetes mellitus with diabetic nephropathy: Secondary | ICD-10-CM | POA: Diagnosis not present

## 2023-01-14 DIAGNOSIS — N1831 Chronic kidney disease, stage 3a: Secondary | ICD-10-CM

## 2023-01-14 DIAGNOSIS — I10 Essential (primary) hypertension: Secondary | ICD-10-CM

## 2023-01-14 NOTE — Progress Notes (Signed)
Office Visit  Subjective   Patient ID: Derrick Mann   DOB: 24-Mar-1936   Age: 87 y.o.   MRN: [id number]   Chief Complaint Chief Complaint  Patient presents with   Follow-up    Hypertension     History of Present Illness The patient is a 87 year old Caucasian/White male who returns for a follow-up visit for his T2 diabetes. Last year year his HgBA1c was elevated in 11/2019 and we started him on rybelsus.  He was diagnosed with T2 diabetes in 12/2016.  He remains on metformin ER  daily and rybelsus  daily.  Since the last visit, there have been no problems.  He is not walking as much as they would like. He specifically denies unexplained abdominal pain, nausea or vomiting, documented hypoglycemia.  He does check his blood sugars about once every couple of weeks and they range 100-150. His last HgbA1c was done 3 months ago and was 5.9%. He has no term complications of diabetes with no diabetic retinopathy, neuropathy or cardiovascular disease.  He does have a history of diabetic nephropathy at this time.     He has a history of Stage IIIa CKD where his last visit with his nephrologist was in 08/2015.  He was told his kidneys were stable. He has been told to stop his lasix.  Again, he has had short term dialysis several years ago when he was admitted to [facility].  His Kidney function near discharge at [facility] last year was 2.1 and his repeat checks in our office his creatinine has range 1.5-2. Patient states he has no problems with urination.   The patient is a 87 year old Caucasian/White male who presents for a follow-up evaluation of hypertension.  On his last visit, his BP was borderline.  This past year, his BP was elevated and I increased his amlodipine from  to  daily.    Last year we also increased his lisinopril from  to  daily.  The patient has not been checking his blood pressure at home. The patient's current medications include: amlodipine 10 mg daily and  lisinopril 40 mg daily. The patient has been tolerating his medications well. The patient denies any headache, visual changes, dizziness, lightheadness, chest pain, shortness of breath, weakness/numbness, and edema.  He reports there have been no other symptoms noted.        Past Medical History Past Medical History:  Diagnosis Date   Back pain    CHF (congestive heart failure)    CKD (chronic kidney disease), stage III    Diabetes mellitus, type 2    Diabetic nephropathy associated with type 2 diabetes mellitus    GERD (gastroesophageal reflux disease)    History of prostate cancer    Hyperlipidemia    Hypertension    Hypothyroidism    Osteoarthritis      Allergies No Known Allergies   Medications  Current Outpatient Medications:    amLODipine (NORVASC) 10 MG tablet, Take 10 mg by mouth daily., Disp: , Rfl:    Choline Fenofibrate (FENOFIBRIC ACID) 135 MG CPDR, Take 1 capsule by mouth daily., Disp: , Rfl:    esomeprazole (NEXIUM) 40 MG capsule, Take 1 capsule (40 mg) by mouth once daily], Disp: 90 capsule, Rfl: 1   levothyroxine (SYNTHROID) 125 MCG tablet, Take 1 tablet (125 mcg) by mouth once daily, Disp: 90 tablet, Rfl: 1   lisinopril (ZESTRIL) 40 MG tablet, TAKE ONE TABLET BY MOUTH EVERY DAY, Disp: 90 tablet, Rfl:  1   metFORMIN (GLUCOPHAGE-XR) 500 MG 24 hr tablet, Take 1,000 mg by mouth 2 (two) times daily., Disp: , Rfl:    Omega-3 1000 MG CAPS, Take by mouth., Disp: , Rfl:    rosuvastatin (CRESTOR) 5 MG tablet, Take 5 mg by mouth daily., Disp: , Rfl:    RYBELSUS 7 MG TABS, take 1 tablet (7 mg) by oral route once daily in the morning 30 min before any food, drink or medication with no more than 4 oz plain water, Disp: 30 tablet, Rfl: 5   Review of Systems Review of Systems  Constitutional:  Negative for chills and fever.  Eyes:  Negative for blurred vision and double vision.  Respiratory:  Negative for cough and shortness of breath.   Cardiovascular:  Negative for chest  pain, palpitations and leg swelling.  Gastrointestinal:  Negative for abdominal pain, constipation, diarrhea, nausea and vomiting.  Musculoskeletal:  Negative for myalgias.  Neurological:  Negative for dizziness, weakness and headaches.  Psychiatric/Behavioral:  Negative for depression. The patient is not nervous/anxious.        Objective:    Vitals BP 128/82   Pulse 78   Temp (!) 97.3 F (36.3 C)   Resp 16   Ht  (1.803 m)   Wt 175 lb 3.2 oz (79.5 kg)   SpO2 99%   BMI 24.44 kg/m    Physical Examination Physical Exam     Assessment & Plan:   Essential hypertension His BP is doing well today.  We will continue on his amlodipine and lisinopril.  Diabetic nephropathy associated with type 2 diabetes mellitus (HCC) His diabetes has been controlled.  We will check his hgBa1c on his next visit.  Our goal will continue to be  <7%.  Stage 3a chronic kidney disease (HCC) We will check his BMP on his next visit.  His CKD has been stable.  I want him to avoid NSAIDS.    Return in about 3 months (around 04/15/2023).   Crist Fat, MD

## 2023-01-14 NOTE — Assessment & Plan Note (Signed)
His diabetes has been controlled.  We will check his hgBa1c on his next visit.  Our goal will continue to be  <7%.

## 2023-01-14 NOTE — Assessment & Plan Note (Signed)
We will check his BMP on his next visit.  His CKD has been stable.  I want him to avoid NSAIDS.

## 2023-01-14 NOTE — Assessment & Plan Note (Signed)
His BP is doing well today.  We will continue on his amlodipine and lisinopril.

## 2023-03-04 ENCOUNTER — Other Ambulatory Visit: Payer: Self-pay | Admitting: Internal Medicine

## 2023-04-06 ENCOUNTER — Other Ambulatory Visit: Payer: Self-pay | Admitting: Internal Medicine

## 2023-04-15 ENCOUNTER — Ambulatory Visit: Payer: Medicare PPO | Admitting: Internal Medicine

## 2023-04-15 ENCOUNTER — Encounter: Payer: Self-pay | Admitting: Internal Medicine

## 2023-04-15 VITALS — BP 126/84 | HR 75 | Temp 97.8°F | Resp 16 | Ht 71.0 in | Wt 176.8 lb

## 2023-04-15 DIAGNOSIS — E1121 Type 2 diabetes mellitus with diabetic nephropathy: Secondary | ICD-10-CM | POA: Diagnosis not present

## 2023-04-15 DIAGNOSIS — E7800 Pure hypercholesterolemia, unspecified: Secondary | ICD-10-CM | POA: Diagnosis not present

## 2023-04-15 DIAGNOSIS — N1832 Chronic kidney disease, stage 3b: Secondary | ICD-10-CM | POA: Insufficient documentation

## 2023-04-15 DIAGNOSIS — E039 Hypothyroidism, unspecified: Secondary | ICD-10-CM | POA: Diagnosis not present

## 2023-04-15 NOTE — Assessment & Plan Note (Signed)
We will check his HgBA1c today with goal <7%.

## 2023-04-15 NOTE — Assessment & Plan Note (Signed)
We will recheck his TFT's today and continue on his levothyroxine.

## 2023-04-15 NOTE — Progress Notes (Signed)
Office Visit  Subjective   Patient ID: Derrick Mann   DOB: 1935-10-16   Age: 87 y.o.   MRN: [id number]   Chief Complaint Chief Complaint  Patient presents with   Follow-up     History of Present Illness The patient is a 87 year old Caucasian/White male who returns for a follow-up visit for his T2 diabetes. He was diagnosed with T2 diabetes in 12/2016.  Since the last visit, there have been no problems.  He remains on metformin ER 2000mg  daily and rybelsus 7mg  daily.  Since the last visit, there have been no problems.  He is not walking as much as they would like. He specifically denies unexplained abdominal pain, nausea or vomiting, documented hypoglycemia.  He does not regularly check his blood sugars. His last HgbA1c was done 6 months ago and was 5.9%. He has no term complications of diabetes with no diabetic retinopathy, neuropathy or cardiovascular disease.  He does have a history of diabetic nephropathy at this time.  His dilated yearly diabetic eye exam was performed on 11/02/2022 and he has no evidence of diabetic retinopathy.   He has a history of Stage IIIa CKD where his last visit with his nephrologist was in 08/2015.  He was told his kidneys were stable. He has been told to stop his lasix.  Again, he has had short term dialysis several years ago when he was admitted to [facility].  His Kidney function near discharge at [facility] last year was 2.1 and his repeat checks in our office his creatinine has range 1.5-2 and his GFR ranges 41-50.  He denies any NSAIDS use.  Bleu K. Pasqua returns today for routine followup on his cholesterol.  On his last visit, his cholesterol was too controlled and I asked him to start taking his crestor 5mg  every other day.  Overall, he states he is doing well and is without any complaints or problems at this time. He specifically denies abdominal pain, nausea, vomiting, diarrhea, myalgias, and fatigue. He remains on dietary management as well as the  following cholesterol lowering medications crestor 5mg  po every other day, and fenofibrate 135mg  daily. He has eaten already today.   The patient is a 87 year old White male who returns for a regularly scheduled thyroid check. Since the last visit, there has been no overall change in her status. She remains on levothyroxine Oral tablet 125 mcg daly. He claims to have no symptoms suggestive of thyroid imbalance specifically denying fatigue, cold intolerance, heat intolerance, tremors, anxiety, unexplained weight changes, diarrhea, constipation and insomnia.         Past Medical History Past Medical History:  Diagnosis Date   Back pain    CHF (congestive heart failure) (HCC)    CKD (chronic kidney disease), stage III (HCC)    Diabetes mellitus, type 2 (HCC)    Diabetic nephropathy associated with type 2 diabetes mellitus (HCC)    GERD (gastroesophageal reflux disease)    History of prostate cancer    Hyperlipidemia    Hypertension    Hypothyroidism    Osteoarthritis      Allergies No Known Allergies   Medications  Current Outpatient Medications:    amLODipine (NORVASC) 10 MG tablet, TAKE ONE TABLET BY MOUTH ONCE DAILY, Disp: 90 tablet, Rfl: 1   Choline Fenofibrate (FENOFIBRIC ACID) 135 MG CPDR, TAKE ONE CAPSULE EVERY DAY, Disp: 90 capsule, Rfl: 1   esomeprazole (NEXIUM) 40 MG capsule, Take 1 capsule (40 mg) by mouth  once daily], Disp: 90 capsule, Rfl: 1   levothyroxine (SYNTHROID) 125 MCG tablet, Take 1 tablet (125 mcg) by mouth once daily, Disp: 90 tablet, Rfl: 1   lisinopril (ZESTRIL) 40 MG tablet, TAKE ONE TABLET BY MOUTH EVERY DAY, Disp: 90 tablet, Rfl: 1   metFORMIN (GLUCOPHAGE-XR) 500 MG 24 hr tablet, TAKE 4 TABLETS BY MOUTH EVERY DAY, Disp: 360 tablet, Rfl: 1   Omega-3 1000 MG CAPS, Take by mouth., Disp: , Rfl:    rosuvastatin (CRESTOR) 5 MG tablet, Take 5 mg by mouth daily., Disp: , Rfl:    RYBELSUS 7 MG TABS, take 1 tablet (7 mg) by oral route once daily in the morning 30  min before any food, drink or medication with no more than 4 oz plain water, Disp: 30 tablet, Rfl: 5   Review of Systems Review of Systems  Constitutional:  Negative for chills and fever.  Eyes:  Negative for blurred vision and double vision.  Respiratory:  Negative for cough and shortness of breath.   Cardiovascular:  Negative for chest pain, palpitations and leg swelling.  Gastrointestinal:  Negative for abdominal pain, constipation, diarrhea, nausea and vomiting.  Genitourinary:  Negative for frequency.  Musculoskeletal:  Negative for myalgias.  Skin:  Negative for itching and rash.  Neurological:  Negative for dizziness, weakness and headaches.  Endo/Heme/Allergies:  Negative for polydipsia.  Psychiatric/Behavioral:  Negative for depression. The patient is not nervous/anxious.        Objective:    Vitals BP 126/84 (BP Location: Left Arm, Patient Position: Sitting, Cuff Size: Normal)   Pulse 75   Temp 97.8 F (36.6 C) (Temporal)   Resp 16   Ht 5\' 11"  (1.803 m)   Wt 176 lb 12.8 oz (80.2 kg)   SpO2 99%   BMI 24.66 kg/m    Physical Examination Physical Exam Constitutional:      Appearance: Normal appearance. He is not ill-appearing.  Neck:     Vascular: No carotid bruit.  Cardiovascular:     Rate and Rhythm: Normal rate and regular rhythm.     Pulses: Normal pulses.     Heart sounds: No murmur heard.    No friction rub. No gallop.  Pulmonary:     Effort: Pulmonary effort is normal. No respiratory distress.     Breath sounds: No wheezing, rhonchi or rales.  Abdominal:     General: Abdomen is flat. Bowel sounds are normal. There is no distension.     Palpations: Abdomen is soft.     Tenderness: There is no abdominal tenderness.  Musculoskeletal:     Right lower leg: No edema.     Left lower leg: No edema.  Skin:    General: Skin is warm and dry.     Findings: No rash.  Neurological:     General: No focal deficit present.     Mental Status: He is alert and  oriented to person, place, and time.  Psychiatric:        Mood and Affect: Mood normal.        Behavior: Behavior normal.        Assessment & Plan:   Hypothyroidism We will recheck his TFT's today and continue on his levothyroxine.  Diabetic nephropathy associated with type 2 diabetes mellitus (HCC) We will check his HgBA1c today with goal <7%.    Stage 3b chronic kidney disease (HCC) He has Stage III a/b CKD which has been going on for yearly.  He had acute renal failure  on top of chronic renal failure when he was admitted to [facility] years ago where she was on a short course of dialysis.  We will continue to control his BP and diabetes.  His urine albumin/creatinine ratio 6 months ago was normal.  I want him to avoid NSAIDS.  Hypercholesterolemia We will check his FLP today since he is on crestor and fenofibrate.    Return in about 3 months (around 07/16/2023).   Crist Fat, MD

## 2023-04-15 NOTE — Assessment & Plan Note (Signed)
We will check his FLP today since he is on crestor and fenofibrate.

## 2023-04-15 NOTE — Assessment & Plan Note (Signed)
He has Stage III a/b CKD which has been going on for yearly.  He had acute renal failure on top of chronic renal failure when he was admitted to [facility] years ago where she was on a short course of dialysis.  We will continue to control his BP and diabetes.  His urine albumin/creatinine ratio 6 months ago was normal.  I want him to avoid NSAIDS.

## 2023-04-16 LAB — CMP14 + ANION GAP
ALT: 12 IU/L (ref 0–44)
AST: 21 IU/L (ref 0–40)
Albumin: 4.2 g/dL (ref 3.7–4.7)
Alkaline Phosphatase: 34 IU/L — ABNORMAL LOW (ref 44–121)
Anion Gap: 14 mmol/L (ref 10.0–18.0)
BUN/Creatinine Ratio: 14 (ref 10–24)
BUN: 21 mg/dL (ref 8–27)
Bilirubin Total: 0.4 mg/dL (ref 0.0–1.2)
CO2: 19 mmol/L — ABNORMAL LOW (ref 20–29)
Calcium: 9.3 mg/dL (ref 8.6–10.2)
Chloride: 105 mmol/L (ref 96–106)
Creatinine, Ser: 1.53 mg/dL — ABNORMAL HIGH (ref 0.76–1.27)
Globulin, Total: 2.3 g/dL (ref 1.5–4.5)
Glucose: 85 mg/dL (ref 70–99)
Potassium: 6.2 mmol/L — ABNORMAL HIGH (ref 3.5–5.2)
Sodium: 138 mmol/L (ref 134–144)
Total Protein: 6.5 g/dL (ref 6.0–8.5)
eGFR: 44 mL/min/{1.73_m2} — ABNORMAL LOW (ref >59)

## 2023-04-16 LAB — LIPID PANEL
Chol/HDL Ratio: 3.5 ratio (ref 0.0–5.0)
Cholesterol, Total: 146 mg/dL (ref 100–199)
HDL: 42 mg/dL (ref >39)
LDL Chol Calc (NIH): 85 mg/dL (ref 0–99)
Triglycerides: 101 mg/dL (ref 0–149)
VLDL Cholesterol Cal: 19 mg/dL (ref 5–40)

## 2023-04-16 LAB — HEMOGLOBIN A1C
Est. average glucose Bld gHb Est-mCnc: 123 mg/dL
Hgb A1c MFr Bld: 5.9 % — ABNORMAL HIGH (ref 4.8–5.6)

## 2023-04-16 LAB — T4, FREE: Free T4: 1.32 ng/dL (ref 0.82–1.77)

## 2023-04-16 LAB — TSH: TSH: 6.39 u[IU]/mL — ABNORMAL HIGH (ref 0.450–4.500)

## 2023-04-23 NOTE — Progress Notes (Signed)
His K+ was elevaged. He needs to come back this week to have a repeat BMP.  Pt notified

## 2023-04-24 ENCOUNTER — Ambulatory Visit: Payer: Medicare PPO

## 2023-04-24 DIAGNOSIS — E875 Hyperkalemia: Secondary | ICD-10-CM | POA: Diagnosis not present

## 2023-05-02 NOTE — Progress Notes (Signed)
His kidney function is about the same and is stable.  Patient is aware of results.

## 2023-06-04 ENCOUNTER — Other Ambulatory Visit: Payer: Self-pay

## 2023-06-04 ENCOUNTER — Other Ambulatory Visit: Payer: Self-pay | Admitting: Internal Medicine

## 2023-06-13 ENCOUNTER — Other Ambulatory Visit: Payer: Self-pay | Admitting: Internal Medicine

## 2023-07-15 ENCOUNTER — Encounter: Payer: Self-pay | Admitting: Internal Medicine

## 2023-07-15 ENCOUNTER — Ambulatory Visit: Payer: Medicare PPO | Admitting: Internal Medicine

## 2023-07-15 VITALS — BP 130/80 | HR 81 | Temp 98.4°F | Resp 17 | Ht 71.0 in | Wt 174.2 lb

## 2023-07-15 DIAGNOSIS — E1121 Type 2 diabetes mellitus with diabetic nephropathy: Secondary | ICD-10-CM

## 2023-07-15 DIAGNOSIS — N1831 Chronic kidney disease, stage 3a: Secondary | ICD-10-CM

## 2023-07-15 DIAGNOSIS — I10 Essential (primary) hypertension: Secondary | ICD-10-CM

## 2023-07-15 NOTE — Assessment & Plan Note (Signed)
We did a urine study with urine microalbumin/creatinine ratio several months ago and this was normal.  We will continue to control his BP and diabetes.  Continue to avoid NSAIDS.

## 2023-07-15 NOTE — Progress Notes (Unsigned)
Office Visit  Subjective   Patient ID: Derrick Mann   DOB: 1936-08-20   Age: 87 y.o.   MRN: [id number]   Chief Complaint Chief Complaint  Patient presents with   Follow-up     History of Present Illness The patient is a 87 year old Caucasian/White male who returns for a follow-up visit for his T2 diabetes. He was diagnosed with T2 diabetes in 12/2016.  Since the last visit, there have been no problems.  He remains on metformin ER 2000mg  daily and rybelsus 7mg  daily.  Since the last visit, there have been no problems.  He is not walking as much as they would like. He specifically denies unexplained abdominal pain, nausea or vomiting, documented hypoglycemia.  He does not regularly check his blood sugars. His last HgbA1c was done 3 months ago and was 5.9%. He has no term complications of diabetes with no diabetic retinopathy, neuropathy or cardiovascular disease.  He does have a history of diabetic nephropathy at this time.  His dilated yearly diabetic eye exam was performed on 11/02/2022 and he has no evidence of diabetic retinopathy.   He has a history of Stage IIIa CKD where his last visit with his nephrologist was in 08/2015.  He was told his kidneys were stable. He has been told to stop his lasix.  Again, he has had short term dialysis several years ago when he was admitted to [facility].  His Kidney function near discharge at [facility] last year was 2.1 and his repeat checks in our office his creatinine has range 1.5-2 and his GFR ranges 41-50.  He denies any NSAIDS use.   The patient is a 87 year old Caucasian/White male who presents for a follow-up evaluation of hypertension. Since his last visit, he has not had any problems with his BP.  The patient has not been checking his blood pressure at home. The patient's current medications include: amlodipine 10 mg daily and lisinopril 40 mg daily. The patient has been tolerating his medications well. The patient denies any headache, visual  changes, dizziness, lightheadness, chest pain, shortness of breath, weakness/numbness, and edema.  He reports there have been no other symptoms noted.       Past Medical History Past Medical History:  Diagnosis Date   Back pain    CHF (congestive heart failure) (HCC)    CKD (chronic kidney disease), stage III (HCC)    Diabetes mellitus, type 2 (HCC)    Diabetic nephropathy associated with type 2 diabetes mellitus (HCC)    GERD (gastroesophageal reflux disease)    History of prostate cancer    Hyperlipidemia    Hypertension    Hypothyroidism    Osteoarthritis      Allergies No Known Allergies   Medications  Current Outpatient Medications:    amLODipine (NORVASC) 10 MG tablet, TAKE ONE TABLET BY MOUTH ONCE DAILY, Disp: 90 tablet, Rfl: 1   Choline Fenofibrate (FENOFIBRIC ACID) 135 MG CPDR, TAKE ONE CAPSULE EVERY DAY, Disp: 90 capsule, Rfl: 1   esomeprazole (NEXIUM) 40 MG capsule, Take 1 capsule (40 mg) by mouth once daily], Disp: 90 capsule, Rfl: 1   levothyroxine (SYNTHROID) 125 MCG tablet, Take 1 tablet (125 mcg) by mouth once daily, Disp: 90 tablet, Rfl: 1   lisinopril (ZESTRIL) 40 MG tablet, TAKE ONE TABLET BY MOUTH EVERY DAY, Disp: 90 tablet, Rfl: 1   metFORMIN (GLUCOPHAGE-XR) 500 MG 24 hr tablet, TAKE 4 TABLETS BY MOUTH EVERY DAY, Disp: 360 tablet, Rfl: 1  Omega-3 1000 MG CAPS, Take by mouth., Disp: , Rfl:    rosuvastatin (CRESTOR) 5 MG tablet, TAKE ONE TABLET BY MOUTH EVERY OTHER DAY, Disp: 90 tablet, Rfl: 1   RYBELSUS 7 MG TABS, Take 1 tablet by mouth every morning., Disp: 30 tablet, Rfl: 5   Review of Systems Review of Systems  Constitutional:  Negative for chills, fever and malaise/fatigue.  Eyes:  Negative for blurred vision and double vision.  Respiratory:  Negative for cough and shortness of breath.   Cardiovascular:  Negative for chest pain, palpitations and leg swelling.  Gastrointestinal:  Negative for abdominal pain, constipation, diarrhea, nausea and  vomiting.  Genitourinary:  Negative for frequency.  Musculoskeletal:  Negative for myalgias.  Skin:  Negative for itching and rash.  Neurological:  Negative for dizziness, weakness and headaches.  Endo/Heme/Allergies:  Negative for polydipsia.       Objective:    Vitals BP 130/80   Pulse 81   Temp 98.4 F (36.9 C)   Resp 17   Ht 5\' 11"  (1.803 m)   Wt 174 lb 3.2 oz (79 kg)   SpO2 99%   BMI 24.30 kg/m    Physical Examination Physical Exam Constitutional:      Appearance: Normal appearance. He is not ill-appearing.  Cardiovascular:     Rate and Rhythm: Normal rate and regular rhythm.     Pulses: Normal pulses.     Heart sounds: No murmur heard.    No friction rub. No gallop.  Pulmonary:     Effort: Pulmonary effort is normal. No respiratory distress.     Breath sounds: No wheezing, rhonchi or rales.  Abdominal:     General: Abdomen is flat. Bowel sounds are normal. There is no distension.     Palpations: Abdomen is soft.     Tenderness: There is no abdominal tenderness.  Musculoskeletal:     Right lower leg: No edema.     Left lower leg: No edema.  Skin:    General: Skin is warm and dry.     Findings: No rash.  Neurological:     General: No focal deficit present.     Mental Status: He is alert and oriented to person, place, and time.  Psychiatric:        Mood and Affect: Mood normal.        Behavior: Behavior normal.        Assessment & Plan:   Essential hypertension His BP is doing well today.  We will continue his current meds and continue to monitor.  Diabetic nephropathy associated with type 2 diabetes mellitus (HCC) His A1c has been controlled this past year.  We will check his HGBA1c on his next visit which will be his yearly exam.  Continue to eat healthy and keep active.  Continue his current meds.  Stage 3a chronic kidney disease (HCC) We did a urine study with urine microalbumin/creatinine ratio several months ago and this was normal.  We will  continue to control his BP and diabetes.  Continue to avoid NSAIDS.    Return in about 3 months (around 10/18/2023) for annual.   Crist Fat, MD

## 2023-07-15 NOTE — Assessment & Plan Note (Signed)
His A1c has been controlled this past year.  We will check his HGBA1c on his next visit which will be his yearly exam.  Continue to eat healthy and keep active.  Continue his current meds.

## 2023-07-15 NOTE — Assessment & Plan Note (Signed)
His BP is doing well today.  We will continue his current meds and continue to monitor.

## 2023-08-03 ENCOUNTER — Other Ambulatory Visit: Payer: Self-pay | Admitting: Internal Medicine

## 2023-09-01 ENCOUNTER — Other Ambulatory Visit: Payer: Self-pay | Admitting: Internal Medicine

## 2023-09-30 ENCOUNTER — Other Ambulatory Visit: Payer: Self-pay | Admitting: Internal Medicine

## 2023-10-05 ENCOUNTER — Other Ambulatory Visit: Payer: Self-pay | Admitting: Internal Medicine

## 2023-10-21 ENCOUNTER — Ambulatory Visit: Payer: Medicare PPO | Admitting: Internal Medicine

## 2023-10-21 ENCOUNTER — Encounter: Payer: Self-pay | Admitting: Internal Medicine

## 2023-10-21 VITALS — BP 136/80 | HR 87 | Temp 98.6°F | Resp 17 | Ht 71.0 in | Wt 180.4 lb

## 2023-10-21 DIAGNOSIS — Z8546 Personal history of malignant neoplasm of prostate: Secondary | ICD-10-CM

## 2023-10-21 DIAGNOSIS — E7800 Pure hypercholesterolemia, unspecified: Secondary | ICD-10-CM

## 2023-10-21 DIAGNOSIS — M150 Primary generalized (osteo)arthritis: Secondary | ICD-10-CM | POA: Diagnosis not present

## 2023-10-21 DIAGNOSIS — Z6825 Body mass index (BMI) 25.0-25.9, adult: Secondary | ICD-10-CM | POA: Diagnosis not present

## 2023-10-21 DIAGNOSIS — E1121 Type 2 diabetes mellitus with diabetic nephropathy: Secondary | ICD-10-CM

## 2023-10-21 DIAGNOSIS — Z0000 Encounter for general adult medical examination without abnormal findings: Secondary | ICD-10-CM | POA: Diagnosis not present

## 2023-10-21 DIAGNOSIS — I10 Essential (primary) hypertension: Secondary | ICD-10-CM | POA: Diagnosis not present

## 2023-10-21 DIAGNOSIS — E039 Hypothyroidism, unspecified: Secondary | ICD-10-CM

## 2023-10-21 DIAGNOSIS — K219 Gastro-esophageal reflux disease without esophagitis: Secondary | ICD-10-CM | POA: Diagnosis not present

## 2023-10-21 DIAGNOSIS — M5030 Other cervical disc degeneration, unspecified cervical region: Secondary | ICD-10-CM

## 2023-10-21 DIAGNOSIS — N1831 Chronic kidney disease, stage 3a: Secondary | ICD-10-CM | POA: Diagnosis not present

## 2023-10-21 NOTE — Assessment & Plan Note (Signed)
He is s/p surgery of his cervical spine and denies any neck pain or problems at this time.

## 2023-10-21 NOTE — Assessment & Plan Note (Signed)
The patient can use tylenol as needed for any arthritic pains.

## 2023-10-21 NOTE — Assessment & Plan Note (Signed)
He has CKD probably due to both diabetes and HTN.  We will continue to control his diabetes and BP.  He is to avoid NSAIDS.

## 2023-10-21 NOTE — Assessment & Plan Note (Signed)
His diabetes has been controlled this year.  He needs another diabetic eye exam next month.  His diabetic foot exam was normal.  WE will check labs and urine studies today.

## 2023-10-21 NOTE — Assessment & Plan Note (Signed)
The patient seems euthyroid.  We will check his TFT's today.

## 2023-10-21 NOTE — Progress Notes (Signed)
Preventive Screening-Counseling & Management     Derrick Mann is a 88 year old Caucasian/White male who presents for his annual wellness exam.  This patient's past medical history Back Pain, Chronic kidney disease, Stage III (moderate), Congestive Heart Failure, Diabetes Mellitus, Type II, GERD, Hyperlipidemia, Hypertension, Hypothyroidism, and Prostate Neoplasm, Malignant.    He had a dilated eye exam performed on 11/02/2022 and he has no evidence of diabetic retinopathy but they are following him for macular degeneration.  He states there has been no change in his vision in the last year.  He did have a colonoscopy done on 07/2015 which showed colon polyps and some diverticulosis. GI wrote to see him back for repeat colonoscopy in 3 years but the patient states they told him he did not have to come back unless he wanted to. He does have a history of prostate cancer diagnosed around 2007 where he has a prostatectomy. He denies any problems with urination at this time. He never received chemo or radiation treatment and saw his urologist on 12/2015 where he was not having any urinary complaints and they discharged him from their service. 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   The patient has never smoked.  The patient does get yearly flu vaccine. He had the pneumovax 23 vaccine in 02/2019. He has a zostavax vaccine since the age of 69. He had a prevnar 13 vaccine in 11/2014. He is not interested in the RSV vaccine.  He has had 3 COVID-19 vaccines including 1 booster. The patient denies any memory loss, depression or anxiety. He is on an ASA 81 mg daily.   The patient is a 88 year old Caucasian/White male who returns for a follow-up visit for his T2 diabetes. He was diagnosed with T2 diabetes in 12/2016.  Since the last visit, there have been no problems.  He remains on metformin ER 2000mg  daily and rybelsus 7mg  daily.  Since the last visit, there have been no problems.  He is not walking as much as  they would like. He specifically denies unexplained abdominal pain, nausea or vomiting, documented hypoglycemia.  He does not regularly check his blood sugars. His last HgbA1c was done 6 months ago and was 5.9%. He has no term complications of diabetes with no diabetic retinopathy, neuropathy or cardiovascular disease.  He does have a history of diabetic nephropathy at this time.  His dilated yearly diabetic eye exam was performed on 11/02/2022 and he has no evidence of diabetic retinopathy.   He has a history of Stage IIIa CKD where his last visit with his nephrologist was in 08/2015.  He was told his kidneys were stable. He has been told to stop his lasix.  Again, he has had short term dialysis several years ago when he was admitted to [facility].  His Kidney function near discharge at [facility] last year was 2.1 and his repeat checks in our office his creatinine has range 1.5-2 and his GFR ranges 41-50.  He denies any NSAIDS use.   The patient is a 88 year old Caucasian/White male who presents for a follow-up evaluation of hypertension. Since his last visit, he has not had any problems with his BP.  The patient has not been checking his blood pressure at home. The patient's current medications include: amlodipine 10 mg daily and lisinopril 40 mg daily. The patient has been tolerating his medications well. The patient denies any headache, visual changes, dizziness, lightheadness, chest pain, shortness of breath,  weakness/numbness, and edema.  He reports there have been no other symptoms noted.    Derrick Mann returns today for routine followup on his cholesterol.  On his last visit, his cholesterol was too controlled and I asked him to start taking his crestor 5mg  every other day.  Overall, he states he is doing well and is without any complaints or problems at this time. He specifically denies abdominal pain, nausea, vomiting, diarrhea, myalgias, and fatigue. He remains on dietary management as well as  the following cholesterol lowering medications crestor 5mg  po every other day, and fenofibrate 135mg  daily. He has eaten already today.   The patient is a 88 year old White male who returns for a regularly scheduled thyroid check. Since the last visit, there has been no overall change in her status. She remains on levothyroxine Oral tablet 125 mcg daly. He claims to have no symptoms suggestive of thyroid imbalance specifically denying fatigue, cold intolerance, heat intolerance, tremors, anxiety, unexplained weight changes, diarrhea, constipation and insomnia.      This patient also has mild chronic systolic CHF which we diagnosed in 2013.  He has had a history of moderately severe CHF in the past where a stress test showed no evidence of ischemia but he did have an EF 26% with global hypokinesis.  An ECHO in 2013 showed a mildly reduced LV systolic function but his recent ECHO from 10/2013 showed a normal systolic function with an EF 53% with mild MR.  He has no baseline symptoms of CHF.  He has not seen a cardiologist since then.  Specifically denied complaints: chest pain, palpitations, exertional dyspnea, worsening orthopnea, worsening edema, and worsening exertional dyspnea. Interval history: none.  An EKG was done in 09/2013 which showed sinus brady with some nonspecific ST changes.   He does have Osteoarthritis of his knees which has affected his exercise.  He also had  swelling in his left wrist in the past where he was sent to rheumatology and was told it was arthritis and was given a steriod injection.  He has not had any problems with his wrist since.  He states he is not having problems with his knees at this time and he denies taking any OTC meds for his arthritis.  Today, he states he has arthritic pain including his buttocks, ankles and feet.   The patient also has a history of GERD which is controlled with nexium.  If he does not takes his meds, he will have reflux symptoms the next day.       Derrick Mann also has a history of anterior decompression of his cervical spine (AICD) on 11/30/2013 of his C4-C5 and C5-C6 spine with Dr. Loralie Champagne.  Since his surgery, the numbness in his left arm is now gone.  Again, the patient came and saw me with neck pain in 08/2013.  I obtained plain films of his neck which showed DDD but he continued to complain of pain where I referred him to Dr. Loralie Champagne.  Dr. Loralie Champagne obtained a MRI of his cervical spine which demonstrated DDD with foraminal stenosis affected the right C4 nerve root, foraminal stenosis affecting both C5 nerve roots with mild central stenosis, smal central disc protrusion of C5-C6 with mild central stenosis with bilateral foraminal stenosis with collapse of the disc space with stenosis affecting both C6 nerve roots, bilateral stenosis of C6-C7- with overall moderate multilevel cervical spondylosis and multilevel foraminal stenosis as described.  Currently states no problems with his neck since  his surgery.     Derrick Mann also has had problems with gynecomastia in the past.  He had a mammogram which showed bilateral gynecomastia R>L but no masses.  We also obtained lab work and his estrogen levels were elevated.  At that point, I had stopped him off of his protonix and his swelling and pain resolved.        Are there smokers in your home (other than you)? Yes- his wife smokes at home  Risk Factors Current exercise habits:  As above   Dietary issues discussed: none   Depression Screen (Note: if answer to either of the following is "Yes", a more complete depression screening is indicated)   Over the past two weeks, have you felt down, depressed or hopeless? No  Over the past two weeks, have you felt little interest or pleasure in doing things? No  Have you lost interest or pleasure in daily life? No  Do you often feel hopeless? No  Do you cry easily over simple problems? No  Activities of Daily Living In your present state of health, do  you have any difficulty performing the following activities?:  Driving? No Managing money?  No Feeding yourself? No Getting from bed to chair? No Climbing a flight of stairs? No Preparing food and eating?: No Bathing or showering? No Getting dressed: No Getting to the toilet? No Using the toilet:No Moving around from place to place: No In the past year have you fallen or had a near fall?:No   Are you sexually active?  No  Do you have more than one partner?  No  Hearing Difficulties: No Do you often ask people to speak up or repeat themselves? No Do you experience ringing or noises in your ears? No Do you have difficulty understanding soft or whispered voices? No   Do you feel that you have a problem with memory? No  Do you often misplace items? No  Do you feel safe at home?  Yes  Cognitive Testing  Alert? Yes  Normal Appearance?Yes  Oriented to person? Yes  Place? Yes   Time? Yes  Recall of three objects?  Yes  Can perform simple calculations? Yes  Displays appropriate judgment?Yes  Can read the correct time from a watch face?Yes  Fall Risk Prevention  Any stairs in or around the home? Yes  If so, are there any without handrails? Yes  Home free of loose throw rugs in walkways, pet beds, electrical cords, etc? Yes  Adequate lighting in your home to reduce risk of falls? Yes  Use of a cane, walker or w/c? No    Time Up and Go  Was the test performed? Yes .  Length of time to ambulate 10 feet: 10 sec.   Gait steady and fast with assistive device    Advanced Directives have been discussed with the patient? Yes   List the Names of Other Physician/Practitioners you currently use: Patient Care Team: Crist Fat, MD as PCP - General (Internal Medicine)    Past Medical History:  Diagnosis Date   Back pain    CHF (congestive heart failure) (HCC)    CKD (chronic kidney disease), stage III (HCC)    Diabetes mellitus, type 2 (HCC)    Diabetic nephropathy  associated with type 2 diabetes mellitus (HCC)    GERD (gastroesophageal reflux disease)    History of prostate cancer    Hyperlipidemia    Hypertension    Hypothyroidism    Osteoarthritis  Past Surgical History:  Procedure Laterality Date   APPENDECTOMY     ORIF CLAVICLE FRACTURE     PROSTATE SURGERY        Current Medications  Current Outpatient Medications  Medication Sig Dispense Refill   amLODipine (NORVASC) 10 MG tablet TAKE ONE TABLET BY MOUTH ONCE DAILY 90 tablet 1   Choline Fenofibrate (FENOFIBRIC ACID) 135 MG CPDR TAKE ONE CAPSULE EVERY DAY 90 capsule 1   esomeprazole (NEXIUM) 40 MG capsule TAKE ONE CAPSULE BY MOUTH ONCE DAILY 90 capsule 1   levothyroxine (SYNTHROID) 125 MCG tablet Take 1 tablet (125 mcg) by mouth once daily 90 tablet 1   lisinopril (ZESTRIL) 40 MG tablet TAKE ONE TABLET BY MOUTH EVERY DAY 90 tablet 1   metFORMIN (GLUCOPHAGE-XR) 500 MG 24 hr tablet TAKE 4 TABLETS BY MOUTH EVERY DAY 360 tablet 1   Omega-3 1000 MG CAPS Take by mouth.     rosuvastatin (CRESTOR) 5 MG tablet TAKE ONE TABLET BY MOUTH EVERY OTHER DAY 90 tablet 1   RYBELSUS 7 MG TABS Take 1 tablet by mouth every morning. 30 tablet 5   No current facility-administered medications for this visit.    Allergies Patient has no known allergies.   Social History Social History   Tobacco Use   Smoking status: Never   Smokeless tobacco: Never  Substance Use Topics   Alcohol use: Never     Review of Systems Review of Systems  Constitutional:  Negative for chills and fever.  Eyes:  Negative for blurred vision and double vision.  Respiratory:  Negative for cough, hemoptysis, shortness of breath and wheezing.   Cardiovascular:  Negative for chest pain and palpitations.  Gastrointestinal:  Negative for abdominal pain, blood in stool, constipation, diarrhea, heartburn, melena, nausea and vomiting.  Genitourinary:  Negative for frequency and hematuria.  Musculoskeletal:  Negative for  myalgias.  Skin:  Negative for itching and rash.  Neurological:  Negative for dizziness, weakness and headaches.  Endo/Heme/Allergies:  Negative for polydipsia.  Psychiatric/Behavioral:  Negative for depression and memory loss. The patient is not nervous/anxious.      Physical Exam:      Body mass index is 25.16 kg/m. BP 136/80   Pulse 87   Temp 98.6 F (37 C)   Resp 17   Ht 5\' 11"  (1.803 m)   Wt 180 lb 6.4 oz (81.8 kg)   SpO2 98%   BMI 25.16 kg/m   Physical Exam Constitutional:      Appearance: Normal appearance. He is not ill-appearing.  HENT:     Head: Normocephalic and atraumatic.     Right Ear: Tympanic membrane, ear canal and external ear normal.     Left Ear: Tympanic membrane, ear canal and external ear normal.     Nose: Nose normal. No congestion or rhinorrhea.     Mouth/Throat:     Mouth: Mucous membranes are dry.     Pharynx: Oropharynx is clear. No oropharyngeal exudate or posterior oropharyngeal erythema.  Eyes:     General: No scleral icterus.    Conjunctiva/sclera: Conjunctivae normal.     Pupils: Pupils are equal, round, and reactive to light.  Neck:     Vascular: No carotid bruit.  Cardiovascular:     Rate and Rhythm: Normal rate and regular rhythm.     Pulses: Normal pulses.     Heart sounds: No murmur heard.    No friction rub. No gallop.  Pulmonary:     Effort: Pulmonary effort is  normal. No respiratory distress.     Breath sounds: No wheezing, rhonchi or rales.  Abdominal:     General: Abdomen is flat. Bowel sounds are normal. There is no distension.     Palpations: Abdomen is soft.     Tenderness: There is no abdominal tenderness.  Musculoskeletal:     Cervical back: Neck supple. No tenderness.     Right lower leg: No edema.     Left lower leg: No edema.  Lymphadenopathy:     Cervical: No cervical adenopathy.  Skin:    General: Skin is warm and dry.     Findings: No rash.  Neurological:     General: No focal deficit present.      Mental Status: He is alert and oriented to person, place, and time.  Psychiatric:        Mood and Affect: Mood normal.        Behavior: Behavior normal.      Assessment:      Diabetic nephropathy associated with type 2 diabetes mellitus (HCC)  Stage 3a chronic kidney disease (HCC)  History of prostate cancer  Essential hypertension  Hypercholesterolemia  Hypothyroidism, unspecified type  Gastroesophageal reflux disease, unspecified whether esophagitis present  Primary osteoarthritis involving multiple joints  DDD (degenerative disc disease), cervical  BMI 25.0-25.9,adult  Gastroesophageal reflux disease without esophagitis    Plan:     During the course of the visit the patient was educated and counseled about appropriate screening and preventive services including:   Pneumococcal vaccine  Influenza vaccine Colorectal cancer screening Advanced Directives discussed today  Diet review for nutrition referral? Yes ____  Not Indicated _X___   Patient Instructions (the written plan) was given to the patient.  Essential hypertension His BP is doing well.  We will continue his current medications with amlodipine and lisinopril.  Gastroesophageal reflux disease without esophagitis His reflux is controlled with nexium  Diabetic nephropathy associated with type 2 diabetes mellitus (HCC) His diabetes has been controlled this year.  He needs another diabetic eye exam next month.  His diabetic foot exam was normal.  WE will check labs and urine studies today.  Hypothyroidism The patient seems euthyroid.  We will check his TFT's today.  DDD (degenerative disc disease), cervical He is s/p surgery of his cervical spine and denies any neck pain or problems at this time.  Primary osteoarthritis involving multiple joints The patient can use tylenol as needed for any arthritic pains.  Stage 3a chronic kidney disease (HCC) He has CKD probably due to both diabetes and HTN.   We will continue to control his diabetes and BP.  He is to avoid NSAIDS.  BMI 25.0-25.9,adult I want him to continue to eat healthy and keep active.  History of prostate cancer There is no recurrence.  We will check a PSA today.  Hypercholesterolemia He remains on crestor and fenofibrate.  WE will check his FLP today.   Prevention Health maintenance discussed.  We discussed advanced directives today.  We will obtain some yearly labs.   Medicare Attestation I have personally reviewed: The patient's medical and social history Their use of alcohol, tobacco or illicit drugs Their current medications and supplements The patient's functional ability including ADLs,fall risks, home safety risks, cognitive, and hearing and visual impairment Diet and physical activities Evidence for depression or mood disorders  The patient's weight, height, and BMI have been recorded in the chart.  I have made referrals, counseling, and provided education to the patient based  on review of the above and I have provided the patient with a written personalized care plan for preventive services.     Crist Fat, MD   10/21/2023

## 2023-10-21 NOTE — Assessment & Plan Note (Signed)
I want him to continue to eat healthy and keep active.

## 2023-10-21 NOTE — Assessment & Plan Note (Signed)
He remains on crestor and fenofibrate.  WE will check his FLP today.

## 2023-10-21 NOTE — Assessment & Plan Note (Signed)
His reflux is controlled with nexium.

## 2023-10-21 NOTE — Assessment & Plan Note (Signed)
There is no recurrence.  We will check a PSA today.

## 2023-10-21 NOTE — Assessment & Plan Note (Signed)
His BP is doing well.  We will continue his current medications with amlodipine and lisinopril.

## 2023-10-22 LAB — CMP14 + ANION GAP
ALT: 15 [IU]/L (ref 0–44)
AST: 23 [IU]/L (ref 0–40)
Albumin: 4.3 g/dL (ref 3.7–4.7)
Alkaline Phosphatase: 37 [IU]/L — ABNORMAL LOW (ref 44–121)
Anion Gap: 13 mmol/L (ref 10.0–18.0)
BUN/Creatinine Ratio: 12 (ref 10–24)
BUN: 20 mg/dL (ref 8–27)
Bilirubin Total: 0.5 mg/dL (ref 0.0–1.2)
CO2: 22 mmol/L (ref 20–29)
Calcium: 9.3 mg/dL (ref 8.6–10.2)
Chloride: 102 mmol/L (ref 96–106)
Creatinine, Ser: 1.73 mg/dL — ABNORMAL HIGH (ref 0.76–1.27)
Globulin, Total: 2.5 g/dL (ref 1.5–4.5)
Glucose: 95 mg/dL (ref 70–99)
Potassium: 5.2 mmol/L (ref 3.5–5.2)
Sodium: 137 mmol/L (ref 134–144)
Total Protein: 6.8 g/dL (ref 6.0–8.5)
eGFR: 38 mL/min/{1.73_m2} — ABNORMAL LOW (ref >59)

## 2023-10-22 LAB — CBC WITH DIFFERENTIAL/PLATELET
Basophils Absolute: 0.1 10*3/uL (ref 0.0–0.2)
Basos: 1 %
EOS (ABSOLUTE): 0.2 10*3/uL (ref 0.0–0.4)
Eos: 2 %
Hematocrit: 46.1 % (ref 37.5–51.0)
Hemoglobin: 15.1 g/dL (ref 13.0–17.7)
Immature Grans (Abs): 0.1 10*3/uL (ref 0.0–0.1)
Immature Granulocytes: 1 %
Lymphocytes Absolute: 2.4 10*3/uL (ref 0.7–3.1)
Lymphs: 27 %
MCH: 30.9 pg (ref 26.6–33.0)
MCHC: 32.8 g/dL (ref 31.5–35.7)
MCV: 95 fL (ref 79–97)
Monocytes Absolute: 0.6 10*3/uL (ref 0.1–0.9)
Monocytes: 7 %
Neutrophils Absolute: 5.5 10*3/uL (ref 1.4–7.0)
Neutrophils: 62 %
Platelets: 296 10*3/uL (ref 150–450)
RBC: 4.88 x10E6/uL (ref 4.14–5.80)
RDW: 12.9 % (ref 11.6–15.4)
WBC: 8.8 10*3/uL (ref 3.4–10.8)

## 2023-10-22 LAB — LIPID PANEL
Chol/HDL Ratio: 3.9 {ratio} (ref 0.0–5.0)
Cholesterol, Total: 149 mg/dL (ref 100–199)
HDL: 38 mg/dL — ABNORMAL LOW (ref >39)
LDL Chol Calc (NIH): 90 mg/dL (ref 0–99)
Triglycerides: 118 mg/dL (ref 0–149)
VLDL Cholesterol Cal: 21 mg/dL (ref 5–40)

## 2023-10-22 LAB — PSA: Prostate Specific Ag, Serum: <0.1 ng/mL (ref 0.0–4.0)

## 2023-10-22 LAB — MICROALBUMIN / CREATININE URINE RATIO
Creatinine, Urine: 116.1 mg/dL
Microalb/Creat Ratio: 8 mg/g{creat} (ref 0–29)
Microalbumin, Urine: 8.9 ug/mL

## 2023-10-22 LAB — HEMOGLOBIN A1C
Est. average glucose Bld gHb Est-mCnc: 126 mg/dL
Hgb A1c MFr Bld: 6 % — ABNORMAL HIGH (ref 4.8–5.6)

## 2023-10-22 LAB — TSH: TSH: 7.15 u[IU]/mL — ABNORMAL HIGH (ref 0.450–4.500)

## 2023-10-22 LAB — T4, FREE: Free T4: 1.2 ng/dL (ref 0.82–1.77)

## 2023-11-04 DIAGNOSIS — E119 Type 2 diabetes mellitus without complications: Secondary | ICD-10-CM | POA: Diagnosis not present

## 2023-11-04 DIAGNOSIS — H353131 Nonexudative age-related macular degeneration, bilateral, early dry stage: Secondary | ICD-10-CM | POA: Diagnosis not present

## 2024-01-13 ENCOUNTER — Other Ambulatory Visit: Payer: Self-pay | Admitting: Internal Medicine

## 2024-01-17 ENCOUNTER — Encounter: Payer: Self-pay | Admitting: Internal Medicine

## 2024-01-17 ENCOUNTER — Other Ambulatory Visit: Payer: Self-pay

## 2024-01-17 ENCOUNTER — Ambulatory Visit: Payer: Medicare PPO | Admitting: Internal Medicine

## 2024-01-17 VITALS — BP 128/82 | HR 74 | Temp 98.6°F | Resp 18 | Ht 71.0 in | Wt 174.8 lb

## 2024-01-17 DIAGNOSIS — E1121 Type 2 diabetes mellitus with diabetic nephropathy: Secondary | ICD-10-CM | POA: Diagnosis not present

## 2024-01-17 DIAGNOSIS — I10 Essential (primary) hypertension: Secondary | ICD-10-CM

## 2024-01-17 DIAGNOSIS — N1831 Chronic kidney disease, stage 3a: Secondary | ICD-10-CM | POA: Diagnosis not present

## 2024-01-17 MED ORDER — RYBELSUS 7 MG PO TABS: 1.0000 | ORAL_TABLET | Freq: Every morning | ORAL | 5 refills | Status: DC

## 2024-01-17 NOTE — Assessment & Plan Note (Signed)
 His kidney function has remained stable.  He is on an ACE-I.  He is not spilling protein from his UA.  Continue to avoid NSAIDS.

## 2024-01-17 NOTE — Progress Notes (Signed)
 Rx Refill

## 2024-01-17 NOTE — Progress Notes (Signed)
 Office Visit  Subjective   Patient ID: Derrick Mann   DOB: May 09, 1936   Age: 88 y.o.   MRN: [id number]   Chief Complaint Chief Complaint  Patient presents with   Follow-up     History of Present Illness The patient is a 88 year old Caucasian/White male who returns for a follow-up visit for his T2 diabetes. He was diagnosed with T2 diabetes in 12/2016.  Since the last visit, there have been no problems.  He remains on metformin ER 2000mg  daily and rybelsus  7mg  daily.  He is not walking as much as they would like. He specifically denies unexplained abdominal pain, nausea or vomiting, documented hypoglycemia.  He does not regularly check his blood sugars. His last HgbA1c was done 3 months ago and was 6%. He has no term complications of diabetes with no diabetic retinopathy, neuropathy or cardiovascular disease.  He does have a history of diabetic nephropathy at this time.  His dilated yearly diabetic eye exam was performed on 11/04/2023 by [facility] with Dr. Tobias Forth and he had no evidence of diabetic retinopathy.   He has a history of Stage IIIa CKD where his last visit with his nephrologist was in 08/2015.  He was told his kidneys were stable. He has been told to stop his lasix.  Again, he has had short term dialysis several years ago when he was admitted to [facility].  His Kidney function near discharge at [facility] last year was 2.1 and his repeat checks in our office his creatinine has range 1.5-2 and his GFR ranges 41-50.  He denies any NSAIDS use.  He remains on lisinopril at this time.   The patient is a 88 year old Caucasian/White male who presents for a follow-up evaluation of hypertension. Since his last visit, he has not had any problems with his BP.  The patient has not been checking his blood pressure at home. The patient's current medications include: amlodipine 10 mg daily and lisinopril 40 mg daily. The patient has been tolerating his medications well. The patient denies any  headache, visual changes, dizziness, lightheadness, chest pain, shortness of breath, weakness/numbness, and edema.  He reports there have been no other symptoms noted.      Past Medical History Past Medical History:  Diagnosis Date   Back pain    CHF (congestive heart failure) (HCC)    CKD (chronic kidney disease), stage III (HCC)    Diabetes mellitus, type 2 (HCC)    Diabetic nephropathy associated with type 2 diabetes mellitus (HCC)    GERD (gastroesophageal reflux disease)    History of prostate cancer    Hyperlipidemia    Hypertension    Hypothyroidism    Osteoarthritis      Allergies No Known Allergies   Medications  Current Outpatient Medications:    amLODipine (NORVASC) 10 MG tablet, TAKE ONE TABLET BY MOUTH ONCE DAILY, Disp: 90 tablet, Rfl: 1   Choline Fenofibrate (FENOFIBRIC ACID) 135 MG CPDR, TAKE ONE CAPSULE EVERY DAY, Disp: 90 capsule, Rfl: 1   esomeprazole (NEXIUM) 40 MG capsule, TAKE ONE CAPSULE BY MOUTH ONCE DAILY, Disp: 90 capsule, Rfl: 1   levothyroxine (SYNTHROID) 125 MCG tablet, Take 1 tablet (125 mcg) by mouth once daily, Disp: 90 tablet, Rfl: 1   lisinopril (ZESTRIL) 40 MG tablet, TAKE ONE TABLET BY MOUTH EVERY DAY, Disp: 90 tablet, Rfl: 1   metFORMIN (GLUCOPHAGE-XR) 500 MG 24 hr tablet, TAKE 4 TABLETS BY MOUTH EVERY DAY, Disp: 360 tablet, Rfl:  1   Omega-3 1000 MG CAPS, Take by mouth., Disp: , Rfl:    rosuvastatin (CRESTOR) 5 MG tablet, TAKE ONE TABLET BY MOUTH EVERY OTHER DAY, Disp: 90 tablet, Rfl: 1   RYBELSUS  7 MG TABS, Take 1 tablet (7 mg total) by mouth every morning., Disp: 30 tablet, Rfl: 5   Review of Systems Review of Systems  Constitutional:  Negative for chills and fever.  Eyes:  Negative for blurred vision and double vision.  Respiratory:  Negative for cough and shortness of breath.   Cardiovascular:  Negative for chest pain, palpitations and leg swelling.  Gastrointestinal:  Negative for abdominal pain, constipation, diarrhea, nausea and  vomiting.  Genitourinary:  Negative for frequency.  Musculoskeletal:  Negative for myalgias.  Skin:  Negative for itching and rash.  Neurological:  Negative for dizziness, weakness and headaches.  Endo/Heme/Allergies:  Positive for polydipsia.       Objective:    Vitals BP 128/82   Pulse 74   Temp 98.6 F (37 C)   Resp 18   Ht 5\' 11"  (1.803 m)   Wt 174 lb 12.8 oz (79.3 kg)   SpO2 99%   BMI 24.38 kg/m    Physical Examination Physical Exam Constitutional:      Appearance: Normal appearance. He is not ill-appearing.  Cardiovascular:     Rate and Rhythm: Normal rate and regular rhythm.     Pulses: Normal pulses.     Heart sounds: No murmur heard.    No friction rub. No gallop.  Pulmonary:     Effort: Pulmonary effort is normal. No respiratory distress.     Breath sounds: No wheezing, rhonchi or rales.  Abdominal:     General: Abdomen is flat. Bowel sounds are normal. There is no distension.     Palpations: Abdomen is soft.     Tenderness: There is no abdominal tenderness.  Musculoskeletal:     Right lower leg: No edema.     Left lower leg: No edema.  Skin:    General: Skin is warm and dry.     Findings: No rash.  Neurological:     General: No focal deficit present.     Mental Status: He is alert and oriented to person, place, and time.  Psychiatric:        Mood and Affect: Mood normal.        Behavior: Behavior normal.        Assessment & Plan:   Essential hypertension His BP remains under good control.  We will continue his current medications.  Diabetic nephropathy associated with type 2 diabetes mellitus (HCC) I am fine with him not checking his FSBS as his HGBa1c's have been controlled.  He will continue on his current meds.  He is uptodate with his eye exams.  Stage 3a chronic kidney disease (HCC) His kidney function has remained stable.  He is on an ACE-I.  He is not spilling protein from his UA.  Continue to avoid NSAIDS.    Return in about 3  months (around 04/17/2024).   Wayne Haines, MD

## 2024-01-17 NOTE — Assessment & Plan Note (Signed)
 His BP remains under good control.  We will continue his current medications.

## 2024-01-17 NOTE — Assessment & Plan Note (Signed)
 I am fine with him not checking his FSBS as his HGBa1c's have been controlled.  He will continue on his current meds.  He is uptodate with his eye exams.

## 2024-02-29 ENCOUNTER — Other Ambulatory Visit: Payer: Self-pay | Admitting: Internal Medicine

## 2024-03-12 ENCOUNTER — Other Ambulatory Visit: Payer: Self-pay | Admitting: Internal Medicine

## 2024-04-16 ENCOUNTER — Encounter: Payer: Self-pay | Admitting: Internal Medicine

## 2024-04-16 ENCOUNTER — Ambulatory Visit: Admitting: Internal Medicine

## 2024-04-16 VITALS — BP 124/72 | HR 71 | Temp 98.6°F | Resp 18 | Ht 71.0 in | Wt 172.0 lb

## 2024-04-16 DIAGNOSIS — E039 Hypothyroidism, unspecified: Secondary | ICD-10-CM

## 2024-04-16 DIAGNOSIS — E7800 Pure hypercholesterolemia, unspecified: Secondary | ICD-10-CM | POA: Diagnosis not present

## 2024-04-16 DIAGNOSIS — E1121 Type 2 diabetes mellitus with diabetic nephropathy: Secondary | ICD-10-CM

## 2024-04-16 DIAGNOSIS — N1831 Chronic kidney disease, stage 3a: Secondary | ICD-10-CM

## 2024-04-16 NOTE — Progress Notes (Signed)
 Office Visit  Subjective   Patient ID: Derrick Mann   DOB: Jun 09, 1936   Age: 88 y.o.   MRN: [id number]   Chief Complaint Chief Complaint  Patient presents with   Follow-up     History of Present Illness The patient is a 88 year old Caucasian/White male who returns for a follow-up visit for his T2 diabetes. He was diagnosed with T2 diabetes in 12/2016.  Since the last visit, there have been no problems.  He remains on metformin ER 2000mg  daily and rybelsus  7mg  daily.  He is not walking as much as they would like. He specifically denies unexplained abdominal pain, nausea or vomiting, documented hypoglycemia.  He does not regularly check his blood sugars. His last HgbA1c was done 6 months ago and was 6%. He has no term complications of diabetes with no diabetic retinopathy, neuropathy or cardiovascular disease.  He does have a history of diabetic nephropathy at this time.  His dilated yearly diabetic eye exam was performed on 11/04/2023 by [facility] with Dr. Maudie and he had no evidence of diabetic retinopathy.   He has a history of Stage IIIa CKD where his last visit with his nephrologist was in 08/2015.  He was told his kidneys were stable. He has been told to stop his lasix.  Again, he has had short term dialysis several years ago when he was admitted to [facility].  His Kidney function near discharge at [facility] last year was 2.1 and his repeat checks in our office his creatinine has range 1.5-2 and his GFR ranges 41-50.  He denies any NSAIDS use.  He remains on lisinopril at this time.   Derrick Mann returns today for routine followup on his cholesterol.  On his last visit, his cholesterol was too controlled and I asked him to start taking his crestor 5mg  every other day.  Overall, he states he is doing well and is without any complaints or problems at this time. He specifically denies abdominal pain, nausea, vomiting, diarrhea, myalgias, and fatigue. He remains on dietary management  as well as the following cholesterol lowering medications crestor 5mg  po every other day, and fenofibrate 135mg  daily. He has eaten already today.   The patient is a 88 year old White male who returns for a regularly scheduled thyroid  check. Since the last visit, there has been no overall change in her status. She remains on levothyroxine Oral tablet 125 mcg daly. He claims to have no symptoms suggestive of thyroid  imbalance specifically denying fatigue, cold intolerance, heat intolerance, tremors, anxiety, unexplained weight changes, diarrhea, constipation and insomnia.       Past Medical History Past Medical History:  Diagnosis Date   Back pain    CHF (congestive heart failure) (HCC)    CKD (chronic kidney disease), stage III (HCC)    Diabetes mellitus, type 2 (HCC)    Diabetic nephropathy associated with type 2 diabetes mellitus (HCC)    GERD (gastroesophageal reflux disease)    History of prostate cancer    Hyperlipidemia    Hypertension    Hypothyroidism    Osteoarthritis      Allergies No Known Allergies   Medications  Current Outpatient Medications:    amLODipine (NORVASC) 10 MG tablet, TAKE ONE TABLET BY MOUTH ONCE DAILY, Disp: 90 tablet, Rfl: 1   Choline Fenofibrate (FENOFIBRIC ACID) 135 MG CPDR, TAKE ONE CAPSULE BY MOUTH EVERY DAY, Disp: 90 capsule, Rfl: 1   esomeprazole (NEXIUM) 40 MG capsule, TAKE ONE  CAPSULE BY MOUTH ONCE DAILY, Disp: 90 capsule, Rfl: 1   levothyroxine (SYNTHROID) 125 MCG tablet, TAKE ONE TABLET BY MOUTH ONCE DAILY, Disp: 90 tablet, Rfl: 1   lisinopril (ZESTRIL) 40 MG tablet, TAKE ONE TABLET BY MOUTH EVERY DAY, Disp: 90 tablet, Rfl: 1   metFORMIN (GLUCOPHAGE-XR) 500 MG 24 hr tablet, TAKE 4 TABLETS BY MOUTH EVERY DAY, Disp: 360 tablet, Rfl: 1   Omega-3 1000 MG CAPS, Take by mouth., Disp: , Rfl:    rosuvastatin (CRESTOR) 5 MG tablet, TAKE ONE TABLET BY MOUTH EVERY OTHER DAY, Disp: 90 tablet, Rfl: 1   RYBELSUS  7 MG TABS, Take 1 tablet (7 mg total) by  mouth every morning., Disp: 30 tablet, Rfl: 5   Review of Systems Review of Systems  Constitutional:  Negative for chills, fever and malaise/fatigue.  Eyes:  Negative for blurred vision and double vision.  Respiratory:  Negative for cough and shortness of breath.   Cardiovascular:  Negative for chest pain, palpitations and leg swelling.  Gastrointestinal:  Negative for abdominal pain, constipation, diarrhea, nausea and vomiting.  Genitourinary:  Negative for frequency.  Musculoskeletal:  Negative for myalgias.  Skin:  Negative for itching and rash.  Neurological:  Negative for dizziness, weakness and headaches.  Endo/Heme/Allergies:  Negative for polydipsia.       Objective:    Vitals BP 124/72   Pulse 71   Temp 98.6 F (37 C)   Resp 18   Ht 5' 11 (1.803 m)   Wt 172 lb (78 kg)   SpO2 98%   BMI 23.99 kg/m    Physical Examination Physical Exam Constitutional:      Appearance: Normal appearance. He is not ill-appearing.  Cardiovascular:     Rate and Rhythm: Normal rate and regular rhythm.     Pulses: Normal pulses.     Heart sounds: No murmur heard.    No friction rub. No gallop.  Pulmonary:     Effort: Pulmonary effort is normal. No respiratory distress.     Breath sounds: No wheezing, rhonchi or rales.  Abdominal:     General: Abdomen is flat. Bowel sounds are normal. There is no distension.     Palpations: Abdomen is soft.     Tenderness: There is no abdominal tenderness.  Musculoskeletal:     Right lower leg: No edema.     Left lower leg: No edema.  Skin:    General: Skin is warm and dry.     Findings: No rash.  Neurological:     General: No focal deficit present.     Mental Status: He is alert and oriented to person, place, and time.  Psychiatric:        Mood and Affect: Mood normal.        Behavior: Behavior normal.        Assessment & Plan:   Diabetic nephropathy associated with type 2 diabetes mellitus (HCC) The patient's diabetes has been  controlled this past year.  We will recheck his HgBA1c and continue on his current medications.  Hypothyroidism He seems euthyroid where we will check his TFT's today.  Stage 3a chronic kidney disease (HCC) We checked his urine studies 6 months ago.  His kidney function and GFR have both been stable.  He is to avoid NSAIDS and we will continue to control his diabetes and HTN.  Hypercholesterolemia We will check his FLP today where he remains on crestor and fenofibrate.    Return in about 3 months (around 07/17/2024).  Selinda Fleeta Finger, MD

## 2024-04-16 NOTE — Assessment & Plan Note (Signed)
 We will check his FLP today where he remains on crestor and fenofibrate.

## 2024-04-16 NOTE — Assessment & Plan Note (Signed)
 We checked his urine studies 6 months ago.  His kidney function and GFR have both been stable.  He is to avoid NSAIDS and we will continue to control his diabetes and HTN.

## 2024-04-16 NOTE — Assessment & Plan Note (Signed)
 The patient's diabetes has been controlled this past year.  We will recheck his HgBA1c and continue on his current medications.

## 2024-04-16 NOTE — Assessment & Plan Note (Signed)
 He seems euthyroid where we will check his TFT's today.

## 2024-04-17 LAB — LIPID PANEL
Chol/HDL Ratio: 4 ratio (ref 0.0–5.0)
Cholesterol, Total: 125 mg/dL (ref 100–199)
HDL: 31 mg/dL — ABNORMAL LOW (ref >39)
LDL Chol Calc (NIH): 74 mg/dL (ref 0–99)
Triglycerides: 105 mg/dL (ref 0–149)
VLDL Cholesterol Cal: 20 mg/dL (ref 5–40)

## 2024-04-17 LAB — TSH: TSH: 5.44 u[IU]/mL — ABNORMAL HIGH (ref 0.450–4.500)

## 2024-04-17 LAB — CMP14 + ANION GAP
ALT: 18 IU/L (ref 0–44)
AST: 23 IU/L (ref 0–40)
Albumin: 3.9 g/dL (ref 3.7–4.7)
Alkaline Phosphatase: 33 IU/L — ABNORMAL LOW (ref 44–121)
Anion Gap: 15 mmol/L (ref 10.0–18.0)
BUN/Creatinine Ratio: 12 (ref 10–24)
BUN: 17 mg/dL (ref 8–27)
Bilirubin Total: 0.4 mg/dL (ref 0.0–1.2)
CO2: 19 mmol/L — ABNORMAL LOW (ref 20–29)
Calcium: 9 mg/dL (ref 8.6–10.2)
Chloride: 107 mmol/L — ABNORMAL HIGH (ref 96–106)
Creatinine, Ser: 1.41 mg/dL — ABNORMAL HIGH (ref 0.76–1.27)
Globulin, Total: 2.2 g/dL (ref 1.5–4.5)
Glucose: 93 mg/dL (ref 70–99)
Potassium: 5.8 mmol/L — ABNORMAL HIGH (ref 3.5–5.2)
Sodium: 141 mmol/L (ref 134–144)
Total Protein: 6.1 g/dL (ref 6.0–8.5)
eGFR: 49 mL/min/1.73 — ABNORMAL LOW (ref >59)

## 2024-04-17 LAB — T4, FREE: Free T4: 1.21 ng/dL (ref 0.82–1.77)

## 2024-04-17 LAB — HEMOGLOBIN A1C
Est. average glucose Bld gHb Est-mCnc: 117 mg/dL
Hgb A1c MFr Bld: 5.7 % — ABNORMAL HIGH (ref 4.8–5.6)

## 2024-04-27 ENCOUNTER — Ambulatory Visit: Payer: Self-pay

## 2024-04-27 NOTE — Progress Notes (Signed)
 Patient called.  Patient aware.  The patient returned our phone call and I have informed him There is no change to his CKD.  His other labs look good. SABRA  Pt aware.

## 2024-04-27 NOTE — Progress Notes (Signed)
 Patient called.  Left message for patient to call back.  I have called and left the patient a voicemail to return our phone call. The patient needs to be informed There is no change to his CKD.  His other labs look good. SABRA

## 2024-04-29 ENCOUNTER — Encounter: Payer: Self-pay | Admitting: Internal Medicine

## 2024-04-29 ENCOUNTER — Ambulatory Visit: Admitting: Internal Medicine

## 2024-04-29 VITALS — BP 122/78 | HR 82 | Temp 97.6°F | Resp 18 | Ht 71.0 in | Wt 171.5 lb

## 2024-04-29 DIAGNOSIS — R1031 Right lower quadrant pain: Secondary | ICD-10-CM | POA: Insufficient documentation

## 2024-04-29 LAB — POCT URINALYSIS DIPSTICK
Bilirubin, UA: NEGATIVE
Blood, UA: NEGATIVE
Glucose, UA: NEGATIVE
Leukocytes, UA: NEGATIVE
Nitrite, UA: NEGATIVE
Protein, UA: NEGATIVE
Spec Grav, UA: 1.02 (ref 1.010–1.025)
Urobilinogen, UA: 2 U/dL — AB
pH, UA: 7 (ref 5.0–8.0)

## 2024-04-29 NOTE — Assessment & Plan Note (Signed)
 The patient has RLQ abdominal pain last week but no pain today.  We will obtain some labs including a CBC, CMP and UA.  I told him if these labs are normal and his pain returns, he needs to come seem medical attention.

## 2024-04-29 NOTE — Progress Notes (Signed)
 Office Visit  Subjective   Patient ID: Derrick Mann   DOB: 1936/09/09   Age: 88 y.o.   MRN: [id number]   Chief Complaint Chief Complaint  Patient presents with   Office visit    Thinks he has gallstones     History of Present Illness Derrick Mann is a 88 yo male who comes in today with complaints of right sided abdominal pain.  This started last Friday about 5 days where he began having acute sharp pain that was severe.  There was no nausea, vomiting, bloating, diarrhea, constipation, fevers, chills or other problems.  He states the pain was a continuous  sharp pain that lasted for about 2 days.  He did not take anything for this pain and did not see any doctors for this.  He was able to eat.  Today, his pain is resolved.  He is not having any problems with BM's and no melena, BRBPR or other problems.  He has had his appendix removed in the past.     Past Medical History Past Medical History:  Diagnosis Date   Back pain    CHF (congestive heart failure) (HCC)    CKD (chronic kidney disease), stage III (HCC)    Diabetes mellitus, type 2 (HCC)    Diabetic nephropathy associated with type 2 diabetes mellitus (HCC)    GERD (gastroesophageal reflux disease)    History of prostate cancer    Hyperlipidemia    Hypertension    Hypothyroidism    Osteoarthritis      Allergies No Known Allergies   Medications  Current Outpatient Medications:    amLODipine (NORVASC) 10 MG tablet, TAKE ONE TABLET BY MOUTH ONCE DAILY, Disp: 90 tablet, Rfl: 1   Choline Fenofibrate (FENOFIBRIC ACID) 135 MG CPDR, TAKE ONE CAPSULE BY MOUTH EVERY DAY, Disp: 90 capsule, Rfl: 1   esomeprazole (NEXIUM) 40 MG capsule, TAKE ONE CAPSULE BY MOUTH ONCE DAILY, Disp: 90 capsule, Rfl: 1   levothyroxine (SYNTHROID) 125 MCG tablet, TAKE ONE TABLET BY MOUTH ONCE DAILY, Disp: 90 tablet, Rfl: 1   lisinopril (ZESTRIL) 40 MG tablet, TAKE ONE TABLET BY MOUTH EVERY DAY, Disp: 90 tablet, Rfl: 1   metFORMIN (GLUCOPHAGE-XR) 500 MG  24 hr tablet, TAKE 4 TABLETS BY MOUTH EVERY DAY, Disp: 360 tablet, Rfl: 1   Omega-3 1000 MG CAPS, Take by mouth., Disp: , Rfl:    rosuvastatin (CRESTOR) 5 MG tablet, TAKE ONE TABLET BY MOUTH EVERY OTHER DAY, Disp: 90 tablet, Rfl: 1   RYBELSUS  7 MG TABS, Take 1 tablet (7 mg total) by mouth every morning., Disp: 30 tablet, Rfl: 5   Review of Systems Review of Systems  Constitutional:  Negative for chills and fever.  Respiratory:  Negative for shortness of breath.   Cardiovascular:  Negative for chest pain.  Gastrointestinal:  Positive for abdominal pain. Negative for blood in stool, constipation, diarrhea, heartburn, melena, nausea and vomiting.  Genitourinary:  Negative for frequency.  Musculoskeletal:  Negative for myalgias.  Neurological:  Negative for dizziness, weakness and headaches.       Objective:    Vitals BP 122/78 (BP Location: Left Arm, Patient Position: Sitting, Cuff Size: Normal)   Pulse 82   Temp 97.6 F (36.4 C)   Resp 18   Ht 5' 11 (1.803 m)   Wt 171 lb 8 oz (77.8 kg)   SpO2 99%   BMI 23.92 kg/m    Physical Examination Physical Exam Constitutional:      Appearance:  Normal appearance. He is not ill-appearing.  Cardiovascular:     Rate and Rhythm: Normal rate and regular rhythm.     Pulses: Normal pulses.     Heart sounds: No murmur heard.    No friction rub. No gallop.  Pulmonary:     Effort: Pulmonary effort is normal. No respiratory distress.     Breath sounds: No wheezing, rhonchi or rales.  Abdominal:     General: Abdomen is flat. Bowel sounds are normal. There is no distension.     Palpations: Abdomen is soft.     Tenderness: There is no abdominal tenderness.  Musculoskeletal:     Right lower leg: No edema.     Left lower leg: No edema.  Skin:    General: Skin is warm and dry.     Findings: No rash.  Neurological:     Mental Status: He is alert.        Assessment & Plan:   Right lower quadrant abdominal pain The patient has RLQ  abdominal pain last week but no pain today.  We will obtain some labs including a CBC, CMP and UA.  I told him if these labs are normal and his pain returns, he needs to come seem medical attention.    No follow-ups on file.   Selinda Fleeta Finger, MD

## 2024-04-30 ENCOUNTER — Ambulatory Visit: Admitting: Internal Medicine

## 2024-04-30 DIAGNOSIS — R1031 Right lower quadrant pain: Secondary | ICD-10-CM | POA: Diagnosis not present

## 2024-04-30 NOTE — Progress Notes (Signed)
 Nurse Visit Lab Draw CMP, CBC  Collected from 04/29/2024

## 2024-05-01 LAB — CMP14 + ANION GAP
ALT: 14 IU/L (ref 0–44)
AST: 21 IU/L (ref 0–40)
Albumin: 4.1 g/dL (ref 3.7–4.7)
Alkaline Phosphatase: 33 IU/L — ABNORMAL LOW (ref 44–121)
Anion Gap: 16 mmol/L (ref 10.0–18.0)
BUN/Creatinine Ratio: 14 (ref 10–24)
BUN: 25 mg/dL (ref 8–27)
Bilirubin Total: 0.5 mg/dL (ref 0.0–1.2)
CO2: 18 mmol/L — ABNORMAL LOW (ref 20–29)
Calcium: 9 mg/dL (ref 8.6–10.2)
Chloride: 107 mmol/L — ABNORMAL HIGH (ref 96–106)
Creatinine, Ser: 1.74 mg/dL — ABNORMAL HIGH (ref 0.76–1.27)
Globulin, Total: 2 g/dL (ref 1.5–4.5)
Glucose: 98 mg/dL (ref 70–99)
Potassium: 5.9 mmol/L — ABNORMAL HIGH (ref 3.5–5.2)
Sodium: 141 mmol/L (ref 134–144)
Total Protein: 6.1 g/dL (ref 6.0–8.5)
eGFR: 38 mL/min/1.73 — ABNORMAL LOW (ref >59)

## 2024-05-01 LAB — CBC WITH DIFFERENTIAL/PLATELET
Basophils Absolute: 0.1 x10E3/uL (ref 0.0–0.2)
Basos: 1 %
EOS (ABSOLUTE): 0.1 x10E3/uL (ref 0.0–0.4)
Eos: 2 %
Hematocrit: 43.6 % (ref 37.5–51.0)
Hemoglobin: 13.9 g/dL (ref 13.0–17.7)
Immature Grans (Abs): 0 x10E3/uL (ref 0.0–0.1)
Immature Granulocytes: 0 %
Lymphocytes Absolute: 1.8 x10E3/uL (ref 0.7–3.1)
Lymphs: 29 %
MCH: 30.8 pg (ref 26.6–33.0)
MCHC: 31.9 g/dL (ref 31.5–35.7)
MCV: 97 fL (ref 79–97)
Monocytes Absolute: 0.5 x10E3/uL (ref 0.1–0.9)
Monocytes: 8 %
Neutrophils Absolute: 3.7 x10E3/uL (ref 1.4–7.0)
Neutrophils: 60 %
Platelets: 250 x10E3/uL (ref 150–450)
RBC: 4.52 x10E6/uL (ref 4.14–5.80)
RDW: 13.1 % (ref 11.6–15.4)
WBC: 6.2 x10E3/uL (ref 3.4–10.8)

## 2024-05-04 ENCOUNTER — Ambulatory Visit: Payer: Self-pay

## 2024-05-04 NOTE — Progress Notes (Signed)
 Patient called.  Left message for patient to call back.  I have called and left the patient a voicemail to return our phone call. The patient needs to be informed His labs look good.  There is no change to his ckd.  His liver and gallbladder labs are unremarkable. .  Letter sent.

## 2024-05-06 DIAGNOSIS — H35372 Puckering of macula, left eye: Secondary | ICD-10-CM | POA: Diagnosis not present

## 2024-05-06 DIAGNOSIS — E119 Type 2 diabetes mellitus without complications: Secondary | ICD-10-CM | POA: Diagnosis not present

## 2024-05-13 ENCOUNTER — Other Ambulatory Visit: Payer: Self-pay | Admitting: Internal Medicine

## 2024-06-02 ENCOUNTER — Other Ambulatory Visit: Payer: Self-pay | Admitting: Internal Medicine

## 2024-07-17 ENCOUNTER — Ambulatory Visit: Admitting: Internal Medicine

## 2024-08-24 ENCOUNTER — Other Ambulatory Visit: Payer: Self-pay | Admitting: Internal Medicine

## 2024-08-24 DIAGNOSIS — E1121 Type 2 diabetes mellitus with diabetic nephropathy: Secondary | ICD-10-CM
